# Patient Record
Sex: Male | Born: 1935 | Race: White | Hispanic: No | Marital: Married | Smoking: Never smoker
Health system: Southern US, Community
[De-identification: ages and names within clinical notes are randomized; demographics above are authoritative.]

## PROBLEM LIST (undated history)

## (undated) DIAGNOSIS — K2990 Gastroduodenitis, unspecified, without bleeding: Secondary | ICD-10-CM

## (undated) DIAGNOSIS — E119 Type 2 diabetes mellitus without complications: Secondary | ICD-10-CM

## (undated) DIAGNOSIS — N400 Enlarged prostate without lower urinary tract symptoms: Secondary | ICD-10-CM

## (undated) DIAGNOSIS — N289 Disorder of kidney and ureter, unspecified: Secondary | ICD-10-CM

## (undated) DIAGNOSIS — E785 Hyperlipidemia, unspecified: Secondary | ICD-10-CM

## (undated) DIAGNOSIS — D649 Anemia, unspecified: Secondary | ICD-10-CM

## (undated) DIAGNOSIS — R42 Dizziness and giddiness: Secondary | ICD-10-CM

## (undated) DIAGNOSIS — I10 Essential (primary) hypertension: Secondary | ICD-10-CM

## (undated) HISTORY — DX: Type 2 diabetes mellitus without complications: E11.9

## (undated) HISTORY — DX: Disorder of kidney and ureter, unspecified: N28.9

## (undated) HISTORY — DX: Hyperlipidemia, unspecified: E78.5

## (undated) HISTORY — DX: Dizziness and giddiness: R42

## (undated) HISTORY — DX: Anemia, unspecified: D64.9

## (undated) HISTORY — DX: Gastroduodenitis, unspecified, without bleeding: K29.90

## (undated) HISTORY — DX: Benign prostatic hyperplasia without lower urinary tract symptoms: N40.0

## (undated) HISTORY — DX: Essential (primary) hypertension: I10

---
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
## 1990-10-02 DIAGNOSIS — I10 Essential (primary) hypertension: Secondary | ICD-10-CM

## 1990-10-02 HISTORY — DX: Essential (primary) hypertension: I10

## 1998-06-02 ENCOUNTER — Encounter: Payer: Self-pay | Admitting: Family Medicine

## 1998-06-02 LAB — CONVERTED CEMR LAB: PSA: 1 ng/mL

## 1999-04-02 DIAGNOSIS — N289 Disorder of kidney and ureter, unspecified: Secondary | ICD-10-CM

## 1999-04-02 HISTORY — DX: Disorder of kidney and ureter, unspecified: N28.9

## 1999-06-03 HISTORY — PX: OTHER SURGICAL HISTORY: SHX169

## 1999-06-20 ENCOUNTER — Encounter: Payer: Self-pay | Admitting: Gastroenterology

## 1999-06-20 DIAGNOSIS — K2990 Gastroduodenitis, unspecified, without bleeding: Secondary | ICD-10-CM

## 1999-06-20 DIAGNOSIS — K449 Diaphragmatic hernia without obstruction or gangrene: Secondary | ICD-10-CM | POA: Insufficient documentation

## 1999-06-20 HISTORY — PX: ESOPHAGOGASTRODUODENOSCOPY: SHX1529

## 1999-06-20 HISTORY — DX: Gastroduodenitis, unspecified, without bleeding: K29.90

## 1999-06-20 HISTORY — PX: COLONOSCOPY: SHX174

## 1999-06-23 ENCOUNTER — Ambulatory Visit (HOSPITAL_COMMUNITY): Admission: RE | Admit: 1999-06-23 | Discharge: 1999-06-23 | Payer: Self-pay | Admitting: Gastroenterology

## 1999-06-23 ENCOUNTER — Encounter: Payer: Self-pay | Admitting: Gastroenterology

## 2000-04-01 ENCOUNTER — Encounter: Payer: Self-pay | Admitting: Family Medicine

## 2001-06-02 ENCOUNTER — Encounter: Payer: Self-pay | Admitting: Family Medicine

## 2002-05-02 ENCOUNTER — Encounter: Payer: Self-pay | Admitting: Family Medicine

## 2002-05-07 DIAGNOSIS — E119 Type 2 diabetes mellitus without complications: Secondary | ICD-10-CM

## 2002-05-07 HISTORY — DX: Type 2 diabetes mellitus without complications: E11.9

## 2002-12-01 ENCOUNTER — Encounter: Payer: Self-pay | Admitting: Family Medicine

## 2002-12-01 LAB — CONVERTED CEMR LAB: PSA: 2.1 ng/mL

## 2003-09-02 ENCOUNTER — Encounter: Payer: Self-pay | Admitting: Family Medicine

## 2003-09-02 LAB — CONVERTED CEMR LAB
Hgb A1c MFr Bld: 5.4 %
Microalbumin U total vol: 5.3 mg/L
PSA: 2.3 ng/mL

## 2004-01-31 ENCOUNTER — Encounter: Payer: Self-pay | Admitting: Family Medicine

## 2004-01-31 LAB — CONVERTED CEMR LAB: Hgb A1c MFr Bld: 5.4 %

## 2004-08-02 ENCOUNTER — Encounter: Payer: Self-pay | Admitting: Family Medicine

## 2004-08-02 LAB — CONVERTED CEMR LAB: Microalbumin U total vol: 2.6 mg/L

## 2004-08-17 ENCOUNTER — Ambulatory Visit: Payer: Self-pay | Admitting: Family Medicine

## 2004-08-19 ENCOUNTER — Ambulatory Visit: Payer: Self-pay | Admitting: Family Medicine

## 2004-09-02 ENCOUNTER — Ambulatory Visit: Payer: Self-pay | Admitting: Family Medicine

## 2004-09-19 ENCOUNTER — Encounter: Admission: RE | Admit: 2004-09-19 | Discharge: 2004-09-19 | Payer: Self-pay | Admitting: Nephrology

## 2004-10-14 ENCOUNTER — Ambulatory Visit (HOSPITAL_COMMUNITY): Admission: RE | Admit: 2004-10-14 | Discharge: 2004-10-14 | Payer: Self-pay | Admitting: Nephrology

## 2005-02-14 ENCOUNTER — Ambulatory Visit: Payer: Self-pay | Admitting: Family Medicine

## 2005-02-16 ENCOUNTER — Ambulatory Visit: Payer: Self-pay | Admitting: Family Medicine

## 2005-03-06 ENCOUNTER — Ambulatory Visit: Payer: Self-pay | Admitting: Family Medicine

## 2005-05-15 ENCOUNTER — Ambulatory Visit: Payer: Self-pay | Admitting: Family Medicine

## 2005-05-17 ENCOUNTER — Ambulatory Visit: Payer: Self-pay | Admitting: Family Medicine

## 2005-06-07 ENCOUNTER — Ambulatory Visit: Payer: Self-pay | Admitting: Family Medicine

## 2005-08-02 ENCOUNTER — Encounter: Payer: Self-pay | Admitting: Family Medicine

## 2005-08-02 LAB — CONVERTED CEMR LAB: PSA: 3.32 ng/mL

## 2005-08-14 ENCOUNTER — Ambulatory Visit: Payer: Self-pay | Admitting: Family Medicine

## 2005-08-17 ENCOUNTER — Ambulatory Visit: Payer: Self-pay | Admitting: Family Medicine

## 2005-09-12 ENCOUNTER — Ambulatory Visit: Payer: Self-pay | Admitting: Family Medicine

## 2005-09-26 ENCOUNTER — Ambulatory Visit: Payer: Self-pay | Admitting: Family Medicine

## 2005-11-02 ENCOUNTER — Encounter: Payer: Self-pay | Admitting: Family Medicine

## 2005-11-02 LAB — CONVERTED CEMR LAB: PSA: 3.45 ng/mL

## 2005-11-14 ENCOUNTER — Ambulatory Visit: Payer: Self-pay | Admitting: Family Medicine

## 2005-11-16 ENCOUNTER — Ambulatory Visit: Payer: Self-pay | Admitting: Family Medicine

## 2006-01-30 ENCOUNTER — Encounter: Payer: Self-pay | Admitting: Family Medicine

## 2006-01-30 LAB — CONVERTED CEMR LAB: PSA: 3.14 ng/mL

## 2006-02-12 ENCOUNTER — Ambulatory Visit: Payer: Self-pay | Admitting: Family Medicine

## 2006-02-14 ENCOUNTER — Ambulatory Visit: Payer: Self-pay | Admitting: Family Medicine

## 2006-08-02 ENCOUNTER — Encounter: Payer: Self-pay | Admitting: Family Medicine

## 2006-08-02 LAB — CONVERTED CEMR LAB: PSA: 3.15 ng/mL

## 2006-08-17 ENCOUNTER — Ambulatory Visit: Payer: Self-pay | Admitting: Family Medicine

## 2006-08-21 ENCOUNTER — Ambulatory Visit: Payer: Self-pay | Admitting: Family Medicine

## 2006-09-14 ENCOUNTER — Ambulatory Visit: Payer: Self-pay | Admitting: Family Medicine

## 2006-09-18 ENCOUNTER — Ambulatory Visit: Payer: Self-pay | Admitting: Family Medicine

## 2006-09-20 ENCOUNTER — Ambulatory Visit: Payer: Self-pay | Admitting: Family Medicine

## 2006-11-02 ENCOUNTER — Encounter: Payer: Self-pay | Admitting: Family Medicine

## 2006-11-19 ENCOUNTER — Ambulatory Visit: Payer: Self-pay | Admitting: Family Medicine

## 2006-11-19 LAB — CONVERTED CEMR LAB
ALT: 14 units/L (ref 0–40)
AST: 17 units/L (ref 0–37)
Alkaline Phosphatase: 69 units/L (ref 39–117)
BUN: 47 mg/dL — ABNORMAL HIGH (ref 6–23)
CO2: 26 meq/L (ref 19–32)
Calcium: 10.5 mg/dL (ref 8.4–10.5)
GFR calc non Af Amer: 28 mL/min
Potassium: 4.8 meq/L (ref 3.5–5.1)
Total Protein: 6.5 g/dL (ref 6.0–8.3)

## 2006-11-21 ENCOUNTER — Ambulatory Visit: Payer: Self-pay | Admitting: Family Medicine

## 2006-12-18 ENCOUNTER — Ambulatory Visit: Payer: Self-pay | Admitting: Family Medicine

## 2006-12-18 LAB — CONVERTED CEMR LAB
Albumin: 3.9 g/dL (ref 3.5–5.2)
BUN: 33 mg/dL — ABNORMAL HIGH (ref 6–23)
Calcium: 10.6 mg/dL — ABNORMAL HIGH (ref 8.4–10.5)
Creatinine, Ser: 2.4 mg/dL — ABNORMAL HIGH (ref 0.4–1.5)
Hgb A1c MFr Bld: 5.8 % (ref 4.6–6.0)
Phosphorus: 2.8 mg/dL (ref 2.3–4.6)

## 2007-01-17 ENCOUNTER — Ambulatory Visit: Payer: Self-pay | Admitting: Family Medicine

## 2007-01-17 LAB — CONVERTED CEMR LAB
Albumin: 3.5 g/dL (ref 3.5–5.2)
BUN: 27 mg/dL — ABNORMAL HIGH (ref 6–23)
Chloride: 105 meq/L (ref 96–112)
GFR calc Af Amer: 39 mL/min
Phosphorus: 2.2 mg/dL — ABNORMAL LOW (ref 2.3–4.6)
Potassium: 4.7 meq/L (ref 3.5–5.1)
Sodium: 137 meq/L (ref 135–145)

## 2007-01-21 ENCOUNTER — Ambulatory Visit: Payer: Self-pay | Admitting: Family Medicine

## 2007-02-14 ENCOUNTER — Telehealth (INDEPENDENT_AMBULATORY_CARE_PROVIDER_SITE_OTHER): Payer: Self-pay | Admitting: *Deleted

## 2007-03-06 ENCOUNTER — Ambulatory Visit: Payer: Self-pay | Admitting: Family Medicine

## 2007-03-06 DIAGNOSIS — T7840XA Allergy, unspecified, initial encounter: Secondary | ICD-10-CM | POA: Insufficient documentation

## 2007-03-06 DIAGNOSIS — R351 Nocturia: Secondary | ICD-10-CM | POA: Insufficient documentation

## 2007-03-06 DIAGNOSIS — N2581 Secondary hyperparathyroidism of renal origin: Secondary | ICD-10-CM | POA: Insufficient documentation

## 2007-03-06 LAB — CONVERTED CEMR LAB
Bacteria, UA: 0
Epithelial cells, urine: 0 /lpf
Ketones, urine, test strip: NEGATIVE

## 2007-03-19 ENCOUNTER — Ambulatory Visit: Payer: Self-pay | Admitting: Family Medicine

## 2007-03-19 DIAGNOSIS — R7982 Elevated C-reactive protein (CRP): Secondary | ICD-10-CM

## 2007-03-19 LAB — CONVERTED CEMR LAB
Creatinine, Ser: 2.3 mg/dL — ABNORMAL HIGH (ref 0.4–1.5)
GFR calc Af Amer: 35 mL/min
GFR calc non Af Amer: 29 mL/min
Glucose, Bld: 98 mg/dL (ref 70–99)
Hgb A1c MFr Bld: 6.1 % — ABNORMAL HIGH (ref 4.6–6.0)
Potassium: 5.2 meq/L — ABNORMAL HIGH (ref 3.5–5.1)
Sodium: 136 meq/L (ref 135–145)

## 2007-03-20 ENCOUNTER — Encounter (INDEPENDENT_AMBULATORY_CARE_PROVIDER_SITE_OTHER): Payer: Self-pay | Admitting: *Deleted

## 2007-03-22 ENCOUNTER — Ambulatory Visit: Payer: Self-pay | Admitting: Family Medicine

## 2007-03-27 ENCOUNTER — Telehealth (INDEPENDENT_AMBULATORY_CARE_PROVIDER_SITE_OTHER): Payer: Self-pay | Admitting: *Deleted

## 2007-04-02 ENCOUNTER — Telehealth: Payer: Self-pay | Admitting: Family Medicine

## 2007-04-02 ENCOUNTER — Ambulatory Visit: Payer: Self-pay | Admitting: Family Medicine

## 2007-04-02 DIAGNOSIS — N401 Enlarged prostate with lower urinary tract symptoms: Secondary | ICD-10-CM

## 2007-04-02 DIAGNOSIS — R5381 Other malaise: Secondary | ICD-10-CM

## 2007-04-02 DIAGNOSIS — R5383 Other fatigue: Secondary | ICD-10-CM | POA: Insufficient documentation

## 2007-04-19 ENCOUNTER — Ambulatory Visit: Payer: Self-pay | Admitting: Family Medicine

## 2007-04-19 DIAGNOSIS — I10 Essential (primary) hypertension: Secondary | ICD-10-CM

## 2007-04-19 LAB — CONVERTED CEMR LAB
GFR calc non Af Amer: 25 mL/min
Potassium: 4.3 meq/L (ref 3.5–5.1)

## 2007-04-24 ENCOUNTER — Ambulatory Visit: Payer: Self-pay | Admitting: Family Medicine

## 2007-04-24 ENCOUNTER — Encounter (INDEPENDENT_AMBULATORY_CARE_PROVIDER_SITE_OTHER): Payer: Self-pay | Admitting: *Deleted

## 2007-04-25 ENCOUNTER — Ambulatory Visit: Payer: Self-pay | Admitting: Family Medicine

## 2007-04-26 ENCOUNTER — Encounter: Payer: Self-pay | Admitting: Family Medicine

## 2007-04-26 DIAGNOSIS — D649 Anemia, unspecified: Secondary | ICD-10-CM

## 2007-04-26 LAB — CONVERTED CEMR LAB
Eosinophils Absolute: 0.2 10*3/uL (ref 0.0–0.6)
Ferritin: 34.4 ng/mL (ref 22.0–322.0)
Hemoglobin: 10.5 g/dL — ABNORMAL LOW (ref 13.0–17.0)
Lymphocytes Relative: 43.2 % (ref 12.0–46.0)
MCHC: 32.8 g/dL (ref 30.0–36.0)
Monocytes Absolute: 0.5 10*3/uL (ref 0.2–0.7)
Monocytes Relative: 9.6 % (ref 3.0–11.0)
Neutro Abs: 2 10*3/uL (ref 1.4–7.7)
Neutrophils Relative %: 42.8 % — ABNORMAL LOW (ref 43.0–77.0)
Platelets: 142 10*3/uL — ABNORMAL LOW (ref 150–400)
RBC: 3.4 M/uL — ABNORMAL LOW (ref 4.22–5.81)
RDW: 12.8 % (ref 11.5–14.6)
TSH: 0.55 microintl units/mL (ref 0.35–5.50)

## 2007-04-29 ENCOUNTER — Ambulatory Visit: Payer: Self-pay | Admitting: Family Medicine

## 2007-04-29 ENCOUNTER — Telehealth: Payer: Self-pay | Admitting: Family Medicine

## 2007-06-10 ENCOUNTER — Ambulatory Visit: Payer: Self-pay | Admitting: Family Medicine

## 2007-06-10 LAB — CONVERTED CEMR LAB
Eosinophils Relative: 3.9 % (ref 0.0–5.0)
Hemoglobin: 10.1 g/dL — ABNORMAL LOW (ref 13.0–17.0)
Lymphocytes Relative: 39 % (ref 12.0–46.0)
MCHC: 33.2 g/dL (ref 30.0–36.0)
Monocytes Absolute: 0.5 10*3/uL (ref 0.2–0.7)
Neutrophils Relative %: 46.2 % (ref 43.0–77.0)
Platelets: 124 10*3/uL — ABNORMAL LOW (ref 150–400)
WBC: 5 10*3/uL (ref 4.5–10.5)

## 2007-06-13 ENCOUNTER — Ambulatory Visit: Payer: Self-pay | Admitting: Family Medicine

## 2007-06-14 ENCOUNTER — Ambulatory Visit: Payer: Self-pay | Admitting: Oncology

## 2007-07-01 ENCOUNTER — Encounter: Payer: Self-pay | Admitting: Family Medicine

## 2007-07-01 LAB — COMPREHENSIVE METABOLIC PANEL
ALT: 17 U/L (ref 0–53)
AST: 21 U/L (ref 0–37)
Albumin: 3.9 g/dL (ref 3.5–5.2)
Alkaline Phosphatase: 69 U/L (ref 39–117)
BUN: 37 mg/dL — ABNORMAL HIGH (ref 6–23)
CO2: 25 mEq/L (ref 19–32)
Calcium: 10.4 mg/dL (ref 8.4–10.5)
Chloride: 100 mEq/L (ref 96–112)
Creatinine, Ser: 2.37 mg/dL — ABNORMAL HIGH (ref 0.40–1.50)
Glucose, Bld: 143 mg/dL — ABNORMAL HIGH (ref 70–99)
Potassium: 4.6 mEq/L (ref 3.5–5.3)
Sodium: 132 mEq/L — ABNORMAL LOW (ref 135–145)
Total Bilirubin: 0.7 mg/dL (ref 0.3–1.2)
Total Protein: 5.9 g/dL — ABNORMAL LOW (ref 6.0–8.3)

## 2007-07-01 LAB — CBC WITH DIFFERENTIAL (CANCER CENTER ONLY)
BASO#: 0 10*3/uL (ref 0.0–0.2)
BASO%: 0.3 % (ref 0.0–2.0)
EOS%: 1.8 % (ref 0.0–7.0)
Eosinophils Absolute: 0.1 10*3/uL (ref 0.0–0.5)
HCT: 28.3 % — ABNORMAL LOW (ref 38.7–49.9)
HGB: 9.7 g/dL — ABNORMAL LOW (ref 13.0–17.1)
LYMPH#: 1.7 10*3/uL (ref 0.9–3.3)
LYMPH%: 31.9 % (ref 14.0–48.0)
MCH: 31.5 pg (ref 28.0–33.4)
MCHC: 34.4 g/dL (ref 32.0–35.9)
MCV: 92 fL (ref 82–98)
MONO#: 0.3 10*3/uL (ref 0.1–0.9)
MONO%: 6.2 % (ref 0.0–13.0)
NEUT#: 3.3 10*3/uL (ref 1.5–6.5)
NEUT%: 59.8 % (ref 40.0–80.0)
Platelets: 130 10*3/uL — ABNORMAL LOW (ref 145–400)
RBC: 3.09 10*6/uL — ABNORMAL LOW (ref 4.20–5.70)
RDW: 12.6 % (ref 10.5–14.6)
WBC: 5.4 10*3/uL (ref 4.0–10.0)

## 2007-07-01 LAB — MORPHOLOGY - CHCC SATELLITE: Platelet Morphology: NORMAL

## 2007-07-03 LAB — IRON AND TIBC
%SAT: 39 % (ref 20–55)
Iron: 116 ug/dL (ref 42–165)
TIBC: 299 ug/dL (ref 215–435)
UIBC: 183 ug/dL

## 2007-07-03 LAB — PROTEIN ELECTROPHORESIS, SERUM
Beta 2: 4.7 % (ref 3.2–6.5)
Beta Globulin: 6.5 % (ref 4.7–7.2)
Gamma Globulin: 12.4 % (ref 11.1–18.8)

## 2007-07-03 LAB — FERRITIN: Ferritin: 32 ng/mL (ref 22–322)

## 2007-07-08 ENCOUNTER — Ambulatory Visit (HOSPITAL_COMMUNITY): Admission: RE | Admit: 2007-07-08 | Discharge: 2007-07-08 | Payer: Self-pay | Admitting: Oncology

## 2007-07-08 ENCOUNTER — Encounter (INDEPENDENT_AMBULATORY_CARE_PROVIDER_SITE_OTHER): Payer: Self-pay | Admitting: Interventional Radiology

## 2007-07-09 ENCOUNTER — Telehealth: Payer: Self-pay | Admitting: Family Medicine

## 2007-07-23 ENCOUNTER — Telehealth (INDEPENDENT_AMBULATORY_CARE_PROVIDER_SITE_OTHER): Payer: Self-pay | Admitting: *Deleted

## 2007-07-26 ENCOUNTER — Ambulatory Visit: Payer: Self-pay | Admitting: Oncology

## 2007-07-26 ENCOUNTER — Ambulatory Visit: Payer: Self-pay | Admitting: Gastroenterology

## 2007-07-29 ENCOUNTER — Encounter: Payer: Self-pay | Admitting: Family Medicine

## 2007-07-29 LAB — CBC WITH DIFFERENTIAL (CANCER CENTER ONLY)
BASO#: 0 10*3/uL (ref 0.0–0.2)
BASO%: 0.3 % (ref 0.0–2.0)
EOS%: 2.9 % (ref 0.0–7.0)
HGB: 9.8 g/dL — ABNORMAL LOW (ref 13.0–17.1)
MCH: 32.2 pg (ref 28.0–33.4)
MCHC: 34.3 g/dL (ref 32.0–35.9)
MONO%: 6.3 % (ref 0.0–13.0)
NEUT#: 2.5 10*3/uL (ref 1.5–6.5)
NEUT%: 55.4 % (ref 40.0–80.0)
RDW: 11.6 % (ref 10.5–14.6)

## 2007-08-05 ENCOUNTER — Telehealth: Payer: Self-pay | Admitting: Family Medicine

## 2007-08-19 LAB — CBC WITH DIFFERENTIAL (CANCER CENTER ONLY)
BASO%: 0.3 % (ref 0.0–2.0)
EOS%: 3.8 % (ref 0.0–7.0)
LYMPH#: 1.8 10*3/uL (ref 0.9–3.3)
MCH: 30.1 pg (ref 28.0–33.4)
MCHC: 33.4 g/dL (ref 32.0–35.9)
MONO%: 7.8 % (ref 0.0–13.0)
NEUT#: 2 10*3/uL (ref 1.5–6.5)
Platelets: 160 10*3/uL (ref 145–400)

## 2007-09-06 ENCOUNTER — Ambulatory Visit: Payer: Self-pay | Admitting: Oncology

## 2007-09-09 LAB — BASIC METABOLIC PANEL - CANCER CENTER ONLY
BUN, Bld: 34 mg/dL — ABNORMAL HIGH (ref 7–22)
Chloride: 100 mEq/L (ref 98–108)
Glucose, Bld: 86 mg/dL (ref 73–118)
Potassium: 4.6 mEq/L (ref 3.3–4.7)

## 2007-09-09 LAB — CBC WITH DIFFERENTIAL (CANCER CENTER ONLY)
Eosinophils Absolute: 0.2 10*3/uL (ref 0.0–0.5)
HGB: 11 g/dL — ABNORMAL LOW (ref 13.0–17.1)
LYMPH#: 1.9 10*3/uL (ref 0.9–3.3)
MONO#: 0.4 10*3/uL (ref 0.1–0.9)
MONO%: 8.6 % (ref 0.0–13.0)
NEUT#: 1.9 10*3/uL (ref 1.5–6.5)
Platelets: 134 10*3/uL — ABNORMAL LOW (ref 145–400)
RBC: 3.83 10*6/uL — ABNORMAL LOW (ref 4.20–5.70)
WBC: 4.4 10*3/uL (ref 4.0–10.0)

## 2007-09-09 LAB — IRON AND TIBC: UIBC: 243 ug/dL

## 2007-09-09 LAB — FERRITIN: Ferritin: 15 ng/mL — ABNORMAL LOW (ref 22–322)

## 2007-09-30 ENCOUNTER — Encounter: Payer: Self-pay | Admitting: Family Medicine

## 2007-09-30 LAB — CBC WITH DIFFERENTIAL (CANCER CENTER ONLY)
BASO#: 0 10*3/uL (ref 0.0–0.2)
Eosinophils Absolute: 0.1 10*3/uL (ref 0.0–0.5)
HCT: 30.9 % — ABNORMAL LOW (ref 38.7–49.9)
HGB: 10.3 g/dL — ABNORMAL LOW (ref 13.0–17.1)
LYMPH%: 38.6 % (ref 14.0–48.0)
MCV: 83 fL (ref 82–98)
MONO#: 0.3 10*3/uL (ref 0.1–0.9)
Platelets: 127 10*3/uL — ABNORMAL LOW (ref 145–400)
RBC: 3.71 10*6/uL — ABNORMAL LOW (ref 4.20–5.70)
WBC: 4 10*3/uL (ref 4.0–10.0)

## 2007-10-17 ENCOUNTER — Ambulatory Visit: Payer: Self-pay | Admitting: Oncology

## 2007-10-21 LAB — CBC WITH DIFFERENTIAL (CANCER CENTER ONLY)
BASO#: 0 10*3/uL (ref 0.0–0.2)
Eosinophils Absolute: 0.2 10*3/uL (ref 0.0–0.5)
HCT: 36.6 % — ABNORMAL LOW (ref 38.7–49.9)
HGB: 12.1 g/dL — ABNORMAL LOW (ref 13.0–17.1)
LYMPH%: 44.8 % (ref 14.0–48.0)
MCH: 27.9 pg — ABNORMAL LOW (ref 28.0–33.4)
MCV: 84 fL (ref 82–98)
MONO#: 0.4 10*3/uL (ref 0.1–0.9)
MONO%: 9.4 % (ref 0.0–13.0)
NEUT%: 41 % (ref 40.0–80.0)
RBC: 4.33 10*6/uL (ref 4.20–5.70)
WBC: 3.9 10*3/uL — ABNORMAL LOW (ref 4.0–10.0)

## 2007-10-30 ENCOUNTER — Ambulatory Visit: Payer: Self-pay | Admitting: Family Medicine

## 2007-10-30 LAB — CONVERTED CEMR LAB
BUN: 39 mg/dL — ABNORMAL HIGH (ref 6–23)
CO2: 25 meq/L (ref 19–32)
Chloride: 109 meq/L (ref 96–112)
Creatinine, Ser: 2.1 mg/dL — ABNORMAL HIGH (ref 0.4–1.5)
GFR calc non Af Amer: 32 mL/min
Glucose, Bld: 100 mg/dL — ABNORMAL HIGH (ref 70–99)
Sodium: 141 meq/L (ref 135–145)

## 2007-11-04 ENCOUNTER — Ambulatory Visit: Payer: Self-pay | Admitting: Family Medicine

## 2007-11-11 LAB — CBC WITH DIFFERENTIAL (CANCER CENTER ONLY)
BASO#: 0 10*3/uL (ref 0.0–0.2)
EOS%: 4.2 % (ref 0.0–7.0)
Eosinophils Absolute: 0.2 10*3/uL (ref 0.0–0.5)
HGB: 11.9 g/dL — ABNORMAL LOW (ref 13.0–17.1)
LYMPH#: 1.7 10*3/uL (ref 0.9–3.3)
MCH: 27.4 pg — ABNORMAL LOW (ref 28.0–33.4)
MONO%: 6.9 % (ref 0.0–13.0)
NEUT#: 2 10*3/uL (ref 1.5–6.5)
Platelets: 110 10*3/uL — ABNORMAL LOW (ref 145–400)
RBC: 4.35 10*6/uL (ref 4.20–5.70)

## 2007-11-27 DIAGNOSIS — I2510 Atherosclerotic heart disease of native coronary artery without angina pectoris: Secondary | ICD-10-CM | POA: Insufficient documentation

## 2007-11-27 DIAGNOSIS — E785 Hyperlipidemia, unspecified: Secondary | ICD-10-CM

## 2007-11-27 DIAGNOSIS — K2960 Other gastritis without bleeding: Secondary | ICD-10-CM | POA: Insufficient documentation

## 2007-11-27 DIAGNOSIS — H9190 Unspecified hearing loss, unspecified ear: Secondary | ICD-10-CM | POA: Insufficient documentation

## 2007-11-28 ENCOUNTER — Ambulatory Visit: Payer: Self-pay | Admitting: Oncology

## 2007-12-02 LAB — CBC WITH DIFFERENTIAL (CANCER CENTER ONLY)
BASO#: 0 10*3/uL (ref 0.0–0.2)
BASO%: 0.3 % (ref 0.0–2.0)
HCT: 33.2 % — ABNORMAL LOW (ref 38.7–49.9)
HGB: 11.4 g/dL — ABNORMAL LOW (ref 13.0–17.1)
LYMPH%: 44.1 % (ref 14.0–48.0)
MCV: 82 fL (ref 82–98)
MONO#: 0.4 10*3/uL (ref 0.1–0.9)
NEUT%: 41.2 % (ref 40.0–80.0)
RDW: 18.4 % — ABNORMAL HIGH (ref 10.5–14.6)
WBC: 4.9 10*3/uL (ref 4.0–10.0)

## 2007-12-04 ENCOUNTER — Encounter: Payer: Self-pay | Admitting: Family Medicine

## 2007-12-11 DIAGNOSIS — E559 Vitamin D deficiency, unspecified: Secondary | ICD-10-CM | POA: Insufficient documentation

## 2007-12-23 ENCOUNTER — Encounter: Payer: Self-pay | Admitting: Family Medicine

## 2007-12-23 LAB — CBC WITH DIFFERENTIAL (CANCER CENTER ONLY)
BASO#: 0 10*3/uL (ref 0.0–0.2)
BASO%: 0.3 % (ref 0.0–2.0)
EOS%: 3.5 % (ref 0.0–7.0)
LYMPH#: 1.5 10*3/uL (ref 0.9–3.3)
MCH: 28.8 pg (ref 28.0–33.4)
MCHC: 32.9 g/dL (ref 32.0–35.9)
MONO%: 8.8 % (ref 0.0–13.0)
NEUT#: 2.1 10*3/uL (ref 1.5–6.5)
Platelets: 134 10*3/uL — ABNORMAL LOW (ref 145–400)
RDW: 18 % — ABNORMAL HIGH (ref 10.5–14.6)

## 2008-01-07 ENCOUNTER — Ambulatory Visit: Payer: Self-pay | Admitting: Oncology

## 2008-01-13 LAB — CBC WITH DIFFERENTIAL (CANCER CENTER ONLY)
BASO#: 0 10*3/uL (ref 0.0–0.2)
BASO%: 0.5 % (ref 0.0–2.0)
EOS%: 3.5 % (ref 0.0–7.0)
HCT: 36.7 % — ABNORMAL LOW (ref 38.7–49.9)
HGB: 12.2 g/dL — ABNORMAL LOW (ref 13.0–17.1)
LYMPH#: 2 10*3/uL (ref 0.9–3.3)
LYMPH%: 45.6 % (ref 14.0–48.0)
MCH: 29.5 pg (ref 28.0–33.4)
MCHC: 33.2 g/dL (ref 32.0–35.9)
MCV: 89 fL (ref 82–98)
MONO%: 9.1 % (ref 0.0–13.0)
NEUT%: 41.3 % (ref 40.0–80.0)
RDW: 14.9 % — ABNORMAL HIGH (ref 10.5–14.6)

## 2008-01-15 ENCOUNTER — Telehealth: Payer: Self-pay | Admitting: Family Medicine

## 2008-01-15 ENCOUNTER — Encounter: Payer: Self-pay | Admitting: Family Medicine

## 2008-01-20 ENCOUNTER — Ambulatory Visit: Payer: Self-pay | Admitting: Family Medicine

## 2008-01-20 LAB — CONVERTED CEMR LAB
CO2: 24 meq/L (ref 19–32)
Calcium: 10.3 mg/dL (ref 8.4–10.5)
GFR calc Af Amer: 41 mL/min
Glucose, Bld: 93 mg/dL (ref 70–99)
Hgb A1c MFr Bld: 5.4 % (ref 4.6–6.0)
Potassium: 4.5 meq/L (ref 3.5–5.1)

## 2008-01-21 ENCOUNTER — Ambulatory Visit: Payer: Self-pay | Admitting: Family Medicine

## 2008-01-22 ENCOUNTER — Telehealth: Payer: Self-pay | Admitting: Family Medicine

## 2008-02-03 LAB — CBC WITH DIFFERENTIAL (CANCER CENTER ONLY)
BASO#: 0 10*3/uL (ref 0.0–0.2)
EOS%: 3.6 % (ref 0.0–7.0)
HCT: 33.6 % — ABNORMAL LOW (ref 38.7–49.9)
HGB: 11.3 g/dL — ABNORMAL LOW (ref 13.0–17.1)
LYMPH#: 2 10*3/uL (ref 0.9–3.3)
LYMPH%: 38.5 % (ref 14.0–48.0)
MCH: 30 pg (ref 28.0–33.4)
MCHC: 33.6 g/dL (ref 32.0–35.9)
MCV: 89 fL (ref 82–98)
MONO%: 7.6 % (ref 0.0–13.0)
NEUT%: 50 % (ref 40.0–80.0)

## 2008-02-20 ENCOUNTER — Ambulatory Visit: Payer: Self-pay | Admitting: Oncology

## 2008-02-20 ENCOUNTER — Ambulatory Visit: Payer: Self-pay | Admitting: Family Medicine

## 2008-02-20 LAB — CONVERTED CEMR LAB
CO2: 22 meq/L (ref 19–32)
GFR calc Af Amer: 50 mL/min
GFR calc non Af Amer: 41 mL/min
Potassium: 5.1 meq/L (ref 3.5–5.1)

## 2008-02-26 ENCOUNTER — Ambulatory Visit: Payer: Self-pay | Admitting: Family Medicine

## 2008-03-16 LAB — CBC WITH DIFFERENTIAL (CANCER CENTER ONLY)
BASO#: 0 10*3/uL (ref 0.0–0.2)
EOS%: 4.8 % (ref 0.0–7.0)
HGB: 12.4 g/dL — ABNORMAL LOW (ref 13.0–17.1)
MCH: 31 pg (ref 28.0–33.4)
MCHC: 35.1 g/dL (ref 32.0–35.9)
MONO%: 7.1 % (ref 0.0–13.0)
NEUT#: 2.6 10*3/uL (ref 1.5–6.5)

## 2008-04-01 ENCOUNTER — Ambulatory Visit: Payer: Self-pay | Admitting: Oncology

## 2008-04-06 LAB — CBC WITH DIFFERENTIAL (CANCER CENTER ONLY)
Eosinophils Absolute: 0.2 10*3/uL (ref 0.0–0.5)
LYMPH#: 2 10*3/uL (ref 0.9–3.3)
MONO#: 0.4 10*3/uL (ref 0.1–0.9)
NEUT#: 2.6 10*3/uL (ref 1.5–6.5)
Platelets: 105 10*3/uL — ABNORMAL LOW (ref 145–400)
RBC: 4.24 10*6/uL (ref 4.20–5.70)
WBC: 5.2 10*3/uL (ref 4.0–10.0)

## 2008-04-27 LAB — CBC WITH DIFFERENTIAL (CANCER CENTER ONLY)
BASO%: 0.3 % (ref 0.0–2.0)
EOS%: 3.9 % (ref 0.0–7.0)
Eosinophils Absolute: 0.2 10*3/uL (ref 0.0–0.5)
MCH: 30.2 pg (ref 28.0–33.4)
MCHC: 34.9 g/dL (ref 32.0–35.9)
MONO%: 7.3 % (ref 0.0–13.0)
NEUT#: 2.1 10*3/uL (ref 1.5–6.5)
Platelets: 85 10*3/uL — ABNORMAL LOW (ref 145–400)
RBC: 3.34 10*6/uL — ABNORMAL LOW (ref 4.20–5.70)

## 2008-05-12 ENCOUNTER — Ambulatory Visit: Payer: Self-pay | Admitting: Oncology

## 2008-05-18 ENCOUNTER — Encounter: Payer: Self-pay | Admitting: Family Medicine

## 2008-05-18 LAB — CBC WITH DIFFERENTIAL (CANCER CENTER ONLY)
Eosinophils Absolute: 0.1 10*3/uL (ref 0.0–0.5)
HCT: 32.7 % — ABNORMAL LOW (ref 38.7–49.9)
LYMPH#: 1.7 10*3/uL (ref 0.9–3.3)
LYMPH%: 39.2 % (ref 14.0–48.0)
MCV: 90 fL (ref 82–98)
MONO#: 0.4 10*3/uL (ref 0.1–0.9)
NEUT%: 48.6 % (ref 40.0–80.0)
Platelets: 124 10*3/uL — ABNORMAL LOW (ref 145–400)
RBC: 3.62 10*6/uL — ABNORMAL LOW (ref 4.20–5.70)
WBC: 4.2 10*3/uL (ref 4.0–10.0)

## 2008-05-18 LAB — IRON AND TIBC
%SAT: 57 % — ABNORMAL HIGH (ref 20–55)
Iron: 146 ug/dL (ref 42–165)
TIBC: 257 ug/dL (ref 215–435)
UIBC: 111 ug/dL

## 2008-06-09 LAB — CBC WITH DIFFERENTIAL (CANCER CENTER ONLY)
BASO%: 0.4 % (ref 0.0–2.0)
EOS%: 4.4 % (ref 0.0–7.0)
HGB: 12.4 g/dL — ABNORMAL LOW (ref 13.0–17.1)
LYMPH#: 1.5 10*3/uL (ref 0.9–3.3)
MCHC: 34.2 g/dL (ref 32.0–35.9)
MONO%: 8.1 % (ref 0.0–13.0)
NEUT#: 2.8 10*3/uL (ref 1.5–6.5)
Platelets: 133 10*3/uL — ABNORMAL LOW (ref 145–400)
RDW: 14.4 % (ref 10.5–14.6)

## 2008-06-17 ENCOUNTER — Encounter: Payer: Self-pay | Admitting: Family Medicine

## 2008-06-17 DIAGNOSIS — M899 Disorder of bone, unspecified: Secondary | ICD-10-CM | POA: Insufficient documentation

## 2008-06-17 DIAGNOSIS — M949 Disorder of cartilage, unspecified: Secondary | ICD-10-CM

## 2008-06-24 ENCOUNTER — Ambulatory Visit: Payer: Self-pay | Admitting: Oncology

## 2008-06-29 LAB — CBC WITH DIFFERENTIAL (CANCER CENTER ONLY)
BASO%: 0.4 % (ref 0.0–2.0)
EOS%: 4.4 % (ref 0.0–7.0)
HCT: 33 % — ABNORMAL LOW (ref 38.7–49.9)
LYMPH#: 1.6 10*3/uL (ref 0.9–3.3)
LYMPH%: 35.5 % (ref 14.0–48.0)
MCH: 31.5 pg (ref 28.0–33.4)
MCHC: 34.7 g/dL (ref 32.0–35.9)
MONO%: 6.4 % (ref 0.0–13.0)
NEUT%: 53.3 % (ref 40.0–80.0)
RDW: 13.6 % (ref 10.5–14.6)

## 2008-07-20 LAB — CBC WITH DIFFERENTIAL (CANCER CENTER ONLY)
BASO%: 0.6 % (ref 0.0–2.0)
HCT: 35.7 % — ABNORMAL LOW (ref 38.7–49.9)
LYMPH%: 37.6 % (ref 14.0–48.0)
MCHC: 34.1 g/dL (ref 32.0–35.9)
MCV: 91 fL (ref 82–98)
MONO#: 0.2 10*3/uL (ref 0.1–0.9)
NEUT%: 50.8 % (ref 40.0–80.0)
RDW: 12.9 % (ref 10.5–14.6)
WBC: 4.1 10*3/uL (ref 4.0–10.0)

## 2008-07-24 ENCOUNTER — Encounter: Payer: Self-pay | Admitting: Family Medicine

## 2008-08-07 ENCOUNTER — Ambulatory Visit: Payer: Self-pay | Admitting: Oncology

## 2008-08-10 LAB — CBC WITH DIFFERENTIAL (CANCER CENTER ONLY)
Eosinophils Absolute: 0.2 10*3/uL (ref 0.0–0.5)
HCT: 34.8 % — ABNORMAL LOW (ref 38.7–49.9)
LYMPH%: 33.8 % (ref 14.0–48.0)
MCV: 89 fL (ref 82–98)
MONO#: 0.4 10*3/uL (ref 0.1–0.9)
NEUT%: 53 % (ref 40.0–80.0)
RBC: 3.9 10*6/uL — ABNORMAL LOW (ref 4.20–5.70)
WBC: 4.8 10*3/uL (ref 4.0–10.0)

## 2008-08-20 ENCOUNTER — Encounter: Payer: Self-pay | Admitting: Family Medicine

## 2008-08-26 ENCOUNTER — Ambulatory Visit: Payer: Self-pay | Admitting: Family Medicine

## 2008-08-30 LAB — CONVERTED CEMR LAB
ALT: 19 units/L (ref 0–53)
Alkaline Phosphatase: 62 units/L (ref 39–117)
Basophils Relative: 0 % (ref 0.0–3.0)
Bilirubin, Direct: 0.1 mg/dL (ref 0.0–0.3)
CO2: 23 meq/L (ref 19–32)
Calcium, Total (PTH): 10.3 mg/dL (ref 8.4–10.5)
Chloride: 109 meq/L (ref 96–112)
Cholesterol: 108 mg/dL (ref 0–200)
Creatinine,U: 183.6 mg/dL
Eosinophils Absolute: 0.2 10*3/uL (ref 0.0–0.7)
GFR calc Af Amer: 35 mL/min
HCT: 38.1 % — ABNORMAL LOW (ref 39.0–52.0)
Hemoglobin: 12.8 g/dL — ABNORMAL LOW (ref 13.0–17.0)
Hgb A1c MFr Bld: 5.9 % (ref 4.6–6.0)
LDL Cholesterol: 51 mg/dL (ref 0–99)
Lymphocytes Relative: 45.6 % (ref 12.0–46.0)
MCV: 92.9 fL (ref 78.0–100.0)
Monocytes Relative: 7.7 % (ref 3.0–12.0)
Neutrophils Relative %: 40.9 % — ABNORMAL LOW (ref 43.0–77.0)
PSA: 0.84 ng/mL (ref 0.10–4.00)
Sodium: 139 meq/L (ref 135–145)
Total Bilirubin: 0.7 mg/dL (ref 0.3–1.2)
Total Protein: 6 g/dL (ref 6.0–8.3)
Vit D, 1,25-Dihydroxy: 35 (ref 30–89)
WBC: 4.3 10*3/uL — ABNORMAL LOW (ref 4.5–10.5)

## 2008-09-03 ENCOUNTER — Ambulatory Visit: Payer: Self-pay | Admitting: Family Medicine

## 2008-09-16 ENCOUNTER — Encounter (INDEPENDENT_AMBULATORY_CARE_PROVIDER_SITE_OTHER): Payer: Self-pay | Admitting: *Deleted

## 2008-09-16 ENCOUNTER — Ambulatory Visit: Payer: Self-pay | Admitting: Family Medicine

## 2008-09-16 LAB — CONVERTED CEMR LAB
OCCULT 1: NEGATIVE
OCCULT 3: NEGATIVE

## 2008-09-17 ENCOUNTER — Ambulatory Visit: Payer: Self-pay | Admitting: Oncology

## 2008-10-05 ENCOUNTER — Telehealth: Payer: Self-pay | Admitting: Family Medicine

## 2008-10-12 LAB — CBC WITH DIFFERENTIAL (CANCER CENTER ONLY)
Eosinophils Absolute: 0.2 10*3/uL (ref 0.0–0.5)
MCV: 88 fL (ref 82–98)
MONO#: 0.4 10*3/uL (ref 0.1–0.9)
NEUT#: 2.1 10*3/uL (ref 1.5–6.5)
Platelets: 137 10*3/uL — ABNORMAL LOW (ref 145–400)
RBC: 3.42 10*6/uL — ABNORMAL LOW (ref 4.20–5.70)
WBC: 4.1 10*3/uL (ref 4.0–10.0)

## 2008-10-29 ENCOUNTER — Ambulatory Visit: Payer: Self-pay | Admitting: Oncology

## 2008-11-09 LAB — CBC WITH DIFFERENTIAL (CANCER CENTER ONLY)
EOS%: 4.5 % (ref 0.0–7.0)
Eosinophils Absolute: 0.2 10*3/uL (ref 0.0–0.5)
LYMPH#: 1.1 10*3/uL (ref 0.9–3.3)
MCH: 30.7 pg (ref 28.0–33.4)
MONO%: 9.4 % (ref 0.0–13.0)
NEUT#: 2.6 10*3/uL (ref 1.5–6.5)
Platelets: 115 10*3/uL — ABNORMAL LOW (ref 145–400)
RBC: 3.79 10*6/uL — ABNORMAL LOW (ref 4.20–5.70)

## 2008-11-30 LAB — CBC WITH DIFFERENTIAL (CANCER CENTER ONLY)
BASO#: 0 10*3/uL (ref 0.0–0.2)
BASO%: 0.8 % (ref 0.0–2.0)
EOS%: 6 % (ref 0.0–7.0)
HGB: 12.2 g/dL — ABNORMAL LOW (ref 13.0–17.1)
MCH: 29.7 pg (ref 28.0–33.4)
MCHC: 32.5 g/dL (ref 32.0–35.9)
MONO%: 7.3 % (ref 0.0–13.0)
NEUT#: 1.8 10*3/uL (ref 1.5–6.5)
Platelets: 112 10*3/uL — ABNORMAL LOW (ref 145–400)
RDW: 13 % (ref 10.5–14.6)

## 2008-12-17 ENCOUNTER — Ambulatory Visit: Payer: Self-pay | Admitting: Oncology

## 2008-12-21 ENCOUNTER — Encounter: Payer: Self-pay | Admitting: Family Medicine

## 2008-12-21 LAB — CBC WITH DIFFERENTIAL (CANCER CENTER ONLY)
BASO#: 0 10*3/uL (ref 0.0–0.2)
BASO%: 0.5 % (ref 0.0–2.0)
EOS%: 4.9 % (ref 0.0–7.0)
HCT: 35.9 % — ABNORMAL LOW (ref 38.7–49.9)
HGB: 12.3 g/dL — ABNORMAL LOW (ref 13.0–17.1)
LYMPH%: 36.8 % (ref 14.0–48.0)
MCH: 30.6 pg (ref 28.0–33.4)
MCHC: 34.1 g/dL (ref 32.0–35.9)
MCV: 90 fL (ref 82–98)
MONO%: 7.7 % (ref 0.0–13.0)
NEUT%: 50.1 % (ref 40.0–80.0)
RDW: 12.1 % (ref 10.5–14.6)

## 2008-12-21 LAB — IRON AND TIBC
%SAT: 48 % (ref 20–55)
TIBC: 269 ug/dL (ref 215–435)

## 2009-01-07 ENCOUNTER — Ambulatory Visit: Payer: Self-pay | Admitting: Family Medicine

## 2009-01-11 LAB — CBC WITH DIFFERENTIAL (CANCER CENTER ONLY)
BASO#: 0 10*3/uL (ref 0.0–0.2)
HCT: 32.5 % — ABNORMAL LOW (ref 38.7–49.9)
HGB: 10.9 g/dL — ABNORMAL LOW (ref 13.0–17.1)
LYMPH#: 1.7 10*3/uL (ref 0.9–3.3)
LYMPH%: 36.1 % (ref 14.0–48.0)
MCHC: 33.4 g/dL (ref 32.0–35.9)
MCV: 90 fL (ref 82–98)
MONO#: 0.4 10*3/uL (ref 0.1–0.9)
NEUT%: 51.6 % (ref 40.0–80.0)
RDW: 12.8 % (ref 10.5–14.6)
WBC: 4.8 10*3/uL (ref 4.0–10.0)

## 2009-01-22 ENCOUNTER — Encounter: Payer: Self-pay | Admitting: Family Medicine

## 2009-01-28 ENCOUNTER — Ambulatory Visit: Payer: Self-pay | Admitting: Oncology

## 2009-02-02 ENCOUNTER — Encounter: Payer: Self-pay | Admitting: Family Medicine

## 2009-02-22 LAB — CBC WITH DIFFERENTIAL (CANCER CENTER ONLY)
BASO%: 0.5 % (ref 0.0–2.0)
Eosinophils Absolute: 0.2 10*3/uL (ref 0.0–0.5)
HCT: 35.5 % — ABNORMAL LOW (ref 38.7–49.9)
LYMPH#: 2.3 10*3/uL (ref 0.9–3.3)
LYMPH%: 38.7 % (ref 14.0–48.0)
MCV: 89 fL (ref 82–98)
MONO#: 0.5 10*3/uL (ref 0.1–0.9)
NEUT%: 48.6 % (ref 40.0–80.0)
RBC: 3.98 10*6/uL — ABNORMAL LOW (ref 4.20–5.70)
RDW: 13.8 % (ref 10.5–14.6)
WBC: 5.8 10*3/uL (ref 4.0–10.0)

## 2009-03-10 ENCOUNTER — Ambulatory Visit: Payer: Self-pay | Admitting: Family Medicine

## 2009-03-12 ENCOUNTER — Ambulatory Visit: Payer: Self-pay | Admitting: Oncology

## 2009-03-15 ENCOUNTER — Encounter: Payer: Self-pay | Admitting: Cardiovascular Disease

## 2009-03-15 ENCOUNTER — Ambulatory Visit: Payer: Self-pay | Admitting: Cardiovascular Disease

## 2009-03-15 ENCOUNTER — Ambulatory Visit: Payer: Self-pay | Admitting: Family Medicine

## 2009-03-15 LAB — CBC WITH DIFFERENTIAL (CANCER CENTER ONLY)
BASO%: 0.5 % (ref 0.0–2.0)
Eosinophils Absolute: 0.1 10*3/uL (ref 0.0–0.5)
LYMPH#: 1.2 10*3/uL (ref 0.9–3.3)
LYMPH%: 27.2 % (ref 14.0–48.0)
MONO#: 0.3 10*3/uL (ref 0.1–0.9)
NEUT#: 2.8 10*3/uL (ref 1.5–6.5)
Platelets: 123 10*3/uL — ABNORMAL LOW (ref 145–400)
RBC: 3.45 10*6/uL — ABNORMAL LOW (ref 4.20–5.70)
RDW: 14.1 % (ref 10.5–14.6)
WBC: 4.3 10*3/uL (ref 4.0–10.0)

## 2009-03-25 ENCOUNTER — Ambulatory Visit: Payer: Self-pay

## 2009-03-25 ENCOUNTER — Encounter: Payer: Self-pay | Admitting: Cardiovascular Disease

## 2009-03-31 ENCOUNTER — Telehealth: Payer: Self-pay | Admitting: Family Medicine

## 2009-03-31 ENCOUNTER — Ambulatory Visit: Payer: Self-pay | Admitting: Family Medicine

## 2009-04-22 ENCOUNTER — Ambulatory Visit: Payer: Self-pay | Admitting: Oncology

## 2009-04-26 LAB — CBC WITH DIFFERENTIAL (CANCER CENTER ONLY)
BASO#: 0 10*3/uL (ref 0.0–0.2)
EOS%: 3.6 % (ref 0.0–7.0)
Eosinophils Absolute: 0.2 10*3/uL (ref 0.0–0.5)
LYMPH%: 36.9 % (ref 14.0–48.0)
MCH: 31.5 pg (ref 28.0–33.4)
MCHC: 34.5 g/dL (ref 32.0–35.9)
MCV: 91 fL (ref 82–98)
MONO%: 9 % (ref 0.0–13.0)
NEUT#: 3 10*3/uL (ref 1.5–6.5)
Platelets: 131 10*3/uL — ABNORMAL LOW (ref 145–400)

## 2009-05-03 ENCOUNTER — Telehealth: Payer: Self-pay | Admitting: Family Medicine

## 2009-06-03 ENCOUNTER — Ambulatory Visit: Payer: Self-pay | Admitting: Oncology

## 2009-06-08 ENCOUNTER — Encounter: Payer: Self-pay | Admitting: Family Medicine

## 2009-06-08 LAB — IRON AND TIBC
Iron: 132 ug/dL (ref 42–165)
TIBC: 265 ug/dL (ref 215–435)
UIBC: 133 ug/dL

## 2009-06-08 LAB — CBC WITH DIFFERENTIAL (CANCER CENTER ONLY)
BASO#: 0 10*3/uL (ref 0.0–0.2)
EOS%: 4.8 % (ref 0.0–7.0)
Eosinophils Absolute: 0.2 10*3/uL (ref 0.0–0.5)
HCT: 37.5 % — ABNORMAL LOW (ref 38.7–49.9)
HGB: 12.6 g/dL — ABNORMAL LOW (ref 13.0–17.1)
MCH: 31.7 pg (ref 28.0–33.4)
MCHC: 33.7 g/dL (ref 32.0–35.9)
MCV: 94 fL (ref 82–98)
MONO%: 8.4 % (ref 0.0–13.0)
NEUT%: 47.3 % (ref 40.0–80.0)
RBC: 3.98 10*6/uL — ABNORMAL LOW (ref 4.20–5.70)

## 2009-06-08 LAB — BASIC METABOLIC PANEL - CANCER CENTER ONLY
BUN, Bld: 41 mg/dL — ABNORMAL HIGH (ref 7–22)
CO2: 26 mEq/L (ref 18–33)
Chloride: 101 mEq/L (ref 98–108)
Creat: 2.3 mg/dl — ABNORMAL HIGH (ref 0.6–1.2)
Glucose, Bld: 104 mg/dL (ref 73–118)
Potassium: 4.9 mEq/L — ABNORMAL HIGH (ref 3.3–4.7)

## 2009-06-14 ENCOUNTER — Ambulatory Visit: Payer: Self-pay | Admitting: Family Medicine

## 2009-06-21 ENCOUNTER — Telehealth: Payer: Self-pay | Admitting: Family Medicine

## 2009-06-22 ENCOUNTER — Telehealth: Payer: Self-pay | Admitting: Family Medicine

## 2009-06-23 ENCOUNTER — Encounter: Payer: Self-pay | Admitting: Family Medicine

## 2009-06-25 ENCOUNTER — Emergency Department: Payer: Self-pay | Admitting: Emergency Medicine

## 2009-07-15 ENCOUNTER — Ambulatory Visit: Payer: Self-pay | Admitting: Oncology

## 2009-07-19 LAB — CBC WITH DIFFERENTIAL (CANCER CENTER ONLY)
BASO#: 0 10*3/uL (ref 0.0–0.2)
Eosinophils Absolute: 0.2 10*3/uL (ref 0.0–0.5)
HCT: 31.6 % — ABNORMAL LOW (ref 38.7–49.9)
HGB: 10.7 g/dL — ABNORMAL LOW (ref 13.0–17.1)
LYMPH#: 1.6 10*3/uL (ref 0.9–3.3)
MCHC: 34 g/dL (ref 32.0–35.9)
MONO#: 0.4 10*3/uL (ref 0.1–0.9)
NEUT%: 50.3 % (ref 40.0–80.0)
RBC: 3.39 10*6/uL — ABNORMAL LOW (ref 4.20–5.70)
WBC: 4.3 10*3/uL (ref 4.0–10.0)

## 2009-07-21 ENCOUNTER — Encounter: Payer: Self-pay | Admitting: Family Medicine

## 2009-08-23 ENCOUNTER — Ambulatory Visit: Payer: Self-pay | Admitting: Oncology

## 2009-08-30 LAB — CBC WITH DIFFERENTIAL (CANCER CENTER ONLY)
BASO%: 0.6 % (ref 0.0–2.0)
HCT: 34 % — ABNORMAL LOW (ref 38.7–49.9)
LYMPH#: 1.8 10*3/uL (ref 0.9–3.3)
MONO#: 0.4 10*3/uL (ref 0.1–0.9)
NEUT#: 2 10*3/uL (ref 1.5–6.5)
NEUT%: 45.4 % (ref 40.0–80.0)
RDW: 12.5 % (ref 10.5–14.6)
WBC: 4.4 10*3/uL (ref 4.0–10.0)

## 2009-09-08 ENCOUNTER — Ambulatory Visit: Payer: Self-pay | Admitting: Family Medicine

## 2009-09-08 LAB — CONVERTED CEMR LAB
BUN: 25 mg/dL — ABNORMAL HIGH (ref 6–23)
Basophils Absolute: 0 10*3/uL (ref 0.0–0.1)
Basophils Relative: 0.6 % (ref 0.0–3.0)
Calcium: 10.5 mg/dL (ref 8.4–10.5)
Chloride: 111 meq/L (ref 96–112)
Eosinophils Relative: 6.5 % — ABNORMAL HIGH (ref 0.0–5.0)
GFR calc non Af Amer: 38.39 mL/min (ref >60)
Glucose, Bld: 102 mg/dL — ABNORMAL HIGH (ref 70–99)
HCT: 39.4 % (ref 39.0–52.0)
Lymphocytes Relative: 43.6 % (ref 12.0–46.0)
MCV: 99 fL (ref 78.0–100.0)
Microalb Creat Ratio: 19.2 mg/g (ref 0.0–30.0)
Microalb, Ur: 3.5 mg/dL — ABNORMAL HIGH (ref 0.0–1.9)
Monocytes Absolute: 0.5 10*3/uL (ref 0.1–1.0)
Monocytes Relative: 9.6 % (ref 3.0–12.0)
Neutrophils Relative %: 39.7 % — ABNORMAL LOW (ref 43.0–77.0)
Platelets: 144 10*3/uL — ABNORMAL LOW (ref 150.0–400.0)
Potassium: 4.8 meq/L (ref 3.5–5.1)
RDW: 13.4 % (ref 11.5–14.6)
Total Bilirubin: 0.8 mg/dL (ref 0.3–1.2)
Total Protein: 6.3 g/dL (ref 6.0–8.3)

## 2009-09-09 ENCOUNTER — Encounter: Payer: Self-pay | Admitting: Family Medicine

## 2009-09-09 LAB — CONVERTED CEMR LAB: PTH: 140.3 pg/mL — ABNORMAL HIGH (ref 14.0–72.0)

## 2009-09-14 ENCOUNTER — Ambulatory Visit: Payer: Self-pay | Admitting: Family Medicine

## 2009-10-02 DIAGNOSIS — R42 Dizziness and giddiness: Secondary | ICD-10-CM

## 2009-10-02 HISTORY — DX: Dizziness and giddiness: R42

## 2009-10-08 ENCOUNTER — Ambulatory Visit: Payer: Self-pay | Admitting: Oncology

## 2009-10-13 ENCOUNTER — Ambulatory Visit: Payer: Self-pay | Admitting: Family Medicine

## 2009-10-13 LAB — CONVERTED CEMR LAB
OCCULT 1: NEGATIVE
OCCULT 2: NEGATIVE
OCCULT 3: NEGATIVE

## 2009-10-14 ENCOUNTER — Encounter: Payer: Self-pay | Admitting: Family Medicine

## 2009-10-14 LAB — CBC WITH DIFFERENTIAL (CANCER CENTER ONLY)
BASO#: 0 10*3/uL (ref 0.0–0.2)
EOS%: 3.3 % (ref 0.0–7.0)
HGB: 12.7 g/dL — ABNORMAL LOW (ref 13.0–17.1)
LYMPH%: 43.2 % (ref 14.0–48.0)
MCH: 31.5 pg (ref 28.0–33.4)
MCHC: 33.5 g/dL (ref 32.0–35.9)
MCV: 94 fL (ref 82–98)
MONO%: 7 % (ref 0.0–13.0)
NEUT#: 2.2 10*3/uL (ref 1.5–6.5)
NEUT%: 46.2 % (ref 40.0–80.0)

## 2009-10-14 LAB — BASIC METABOLIC PANEL - CANCER CENTER ONLY
BUN, Bld: 39 mg/dL — ABNORMAL HIGH (ref 7–22)
Calcium: 10.9 mg/dL — ABNORMAL HIGH (ref 8.0–10.3)
Creat: 2.2 mg/dl — ABNORMAL HIGH (ref 0.6–1.2)

## 2009-10-14 LAB — CALCIUM, IONIZED: Calcium, Ion: 1.54 mmol/L — ABNORMAL HIGH (ref 1.12–1.32)

## 2009-10-14 LAB — IRON AND TIBC
Iron: 115 ug/dL (ref 42–165)
UIBC: 136 ug/dL

## 2009-10-15 LAB — PTH, INTACT AND CALCIUM
Calcium, Total (PTH): 10.6 mg/dL — ABNORMAL HIGH (ref 8.4–10.5)
PTH: 171.1 pg/mL — ABNORMAL HIGH (ref 14.0–72.0)

## 2009-10-18 ENCOUNTER — Encounter (INDEPENDENT_AMBULATORY_CARE_PROVIDER_SITE_OTHER): Payer: Self-pay | Admitting: *Deleted

## 2009-11-19 ENCOUNTER — Ambulatory Visit: Payer: Self-pay | Admitting: Oncology

## 2009-11-23 ENCOUNTER — Encounter: Payer: Self-pay | Admitting: Family Medicine

## 2009-11-23 LAB — CMP (CANCER CENTER ONLY)
ALT(SGPT): 17 U/L (ref 10–47)
AST: 20 U/L (ref 11–38)
Albumin: 3.5 g/dL (ref 3.3–5.5)
BUN, Bld: 33 mg/dL — ABNORMAL HIGH (ref 7–22)
Calcium: 10.5 mg/dL — ABNORMAL HIGH (ref 8.0–10.3)
Chloride: 101 mEq/L (ref 98–108)
Potassium: 4.8 mEq/L — ABNORMAL HIGH (ref 3.3–4.7)

## 2009-11-23 LAB — CBC WITH DIFFERENTIAL (CANCER CENTER ONLY)
BASO#: 0 10*3/uL (ref 0.0–0.2)
EOS%: 4.5 % (ref 0.0–7.0)
HGB: 12.1 g/dL — ABNORMAL LOW (ref 13.0–17.1)
LYMPH#: 1.6 10*3/uL (ref 0.9–3.3)
MCHC: 34.5 g/dL (ref 32.0–35.9)
NEUT#: 2.2 10*3/uL (ref 1.5–6.5)
RBC: 3.84 10*6/uL — ABNORMAL LOW (ref 4.20–5.70)
WBC: 4.2 10*3/uL (ref 4.0–10.0)

## 2010-01-07 ENCOUNTER — Ambulatory Visit: Payer: Self-pay | Admitting: Family Medicine

## 2010-01-10 ENCOUNTER — Ambulatory Visit: Payer: Self-pay | Admitting: Family Medicine

## 2010-01-11 ENCOUNTER — Ambulatory Visit: Payer: Self-pay | Admitting: Family Medicine

## 2010-01-11 ENCOUNTER — Telehealth: Payer: Self-pay | Admitting: Family Medicine

## 2010-01-11 ENCOUNTER — Encounter: Payer: Self-pay | Admitting: Family Medicine

## 2010-01-11 HISTORY — PX: OTHER SURGICAL HISTORY: SHX169

## 2010-01-12 ENCOUNTER — Encounter: Payer: Self-pay | Admitting: Family Medicine

## 2010-01-13 ENCOUNTER — Ambulatory Visit: Payer: Self-pay | Admitting: Family Medicine

## 2010-01-25 ENCOUNTER — Ambulatory Visit: Payer: Self-pay | Admitting: Family Medicine

## 2010-02-01 ENCOUNTER — Ambulatory Visit: Payer: Self-pay | Admitting: Family Medicine

## 2010-02-16 ENCOUNTER — Ambulatory Visit: Payer: Self-pay | Admitting: Oncology

## 2010-02-17 ENCOUNTER — Encounter: Payer: Self-pay | Admitting: Family Medicine

## 2010-02-17 LAB — CONVERTED CEMR LAB
BUN: 32 mg/dL — ABNORMAL HIGH (ref 6–23)
Chloride: 105 meq/L (ref 96–112)
Chloride: 102 meq/L (ref 96–112)
Creatinine, Ser: 2.2 mg/dL — ABNORMAL HIGH (ref 0.4–1.5)
GFR calc non Af Amer: 25.09 mL/min (ref >60)
GFR calc non Af Amer: 30.43 mL/min (ref >60)
Glucose, Bld: 120 mg/dL — ABNORMAL HIGH (ref 70–99)
Potassium: 5 meq/L (ref 3.5–5.1)
Sodium: 134 meq/L — ABNORMAL LOW (ref 135–145)

## 2010-02-22 ENCOUNTER — Encounter: Payer: Self-pay | Admitting: Family Medicine

## 2010-02-22 LAB — CBC WITH DIFFERENTIAL (CANCER CENTER ONLY)
BASO%: 0.5 % (ref 0.0–2.0)
EOS%: 2.5 % (ref 0.0–7.0)
LYMPH#: 1.2 10*3/uL (ref 0.9–3.3)
MCHC: 34.7 g/dL (ref 32.0–35.9)
NEUT#: 2.7 10*3/uL (ref 1.5–6.5)
NEUT%: 61.8 % (ref 40.0–80.0)
Platelets: 127 10*3/uL — ABNORMAL LOW (ref 145–400)
RDW: 11.5 % (ref 10.5–14.6)

## 2010-03-14 ENCOUNTER — Ambulatory Visit: Payer: Self-pay | Admitting: Family Medicine

## 2010-03-14 LAB — CONVERTED CEMR LAB
Albumin: 3.8 g/dL (ref 3.5–5.2)
Chloride: 112 meq/L (ref 96–112)
GFR calc non Af Amer: 36.46 mL/min (ref >60)
Sodium: 142 meq/L (ref 135–145)

## 2010-03-17 ENCOUNTER — Ambulatory Visit: Payer: Self-pay | Admitting: Family Medicine

## 2010-04-21 ENCOUNTER — Telehealth: Payer: Self-pay | Admitting: Family Medicine

## 2010-04-21 ENCOUNTER — Encounter: Payer: Self-pay | Admitting: Family Medicine

## 2010-05-03 ENCOUNTER — Encounter: Payer: Self-pay | Admitting: Family Medicine

## 2010-05-03 ENCOUNTER — Telehealth: Payer: Self-pay | Admitting: Family Medicine

## 2010-05-04 ENCOUNTER — Telehealth: Payer: Self-pay | Admitting: Family Medicine

## 2010-05-05 ENCOUNTER — Telehealth: Payer: Self-pay | Admitting: Family Medicine

## 2010-05-06 ENCOUNTER — Ambulatory Visit: Payer: Self-pay | Admitting: Family Medicine

## 2010-05-06 DIAGNOSIS — R110 Nausea: Secondary | ICD-10-CM | POA: Insufficient documentation

## 2010-05-10 ENCOUNTER — Encounter (INDEPENDENT_AMBULATORY_CARE_PROVIDER_SITE_OTHER): Payer: Self-pay | Admitting: *Deleted

## 2010-05-11 ENCOUNTER — Telehealth: Payer: Self-pay | Admitting: Family Medicine

## 2010-05-13 ENCOUNTER — Telehealth: Payer: Self-pay | Admitting: Family Medicine

## 2010-05-13 DIAGNOSIS — R42 Dizziness and giddiness: Secondary | ICD-10-CM | POA: Insufficient documentation

## 2010-05-18 ENCOUNTER — Encounter: Payer: Self-pay | Admitting: Family Medicine

## 2010-05-18 ENCOUNTER — Ambulatory Visit: Payer: Self-pay | Admitting: Family Medicine

## 2010-05-18 ENCOUNTER — Encounter (INDEPENDENT_AMBULATORY_CARE_PROVIDER_SITE_OTHER): Payer: Self-pay | Admitting: *Deleted

## 2010-06-01 ENCOUNTER — Ambulatory Visit: Payer: Self-pay | Admitting: Oncology

## 2010-06-07 ENCOUNTER — Encounter: Payer: Self-pay | Admitting: Family Medicine

## 2010-06-07 LAB — CBC WITH DIFFERENTIAL (CANCER CENTER ONLY)
BASO#: 0 10*3/uL (ref 0.0–0.2)
EOS%: 2.3 % (ref 0.0–7.0)
HGB: 10.5 g/dL — ABNORMAL LOW (ref 13.0–17.1)
LYMPH#: 1.5 10*3/uL (ref 0.9–3.3)
MCHC: 35.6 g/dL (ref 32.0–35.9)
NEUT#: 2.4 10*3/uL (ref 1.5–6.5)
Platelets: 125 10*3/uL — ABNORMAL LOW (ref 145–400)
RBC: 3.25 10*6/uL — ABNORMAL LOW (ref 4.20–5.70)

## 2010-06-07 LAB — CMP (CANCER CENTER ONLY)
ALT(SGPT): 14 U/L (ref 10–47)
AST: 18 U/L (ref 11–38)
Albumin: 3.8 g/dL (ref 3.3–5.5)
Alkaline Phosphatase: 92 U/L — ABNORMAL HIGH (ref 26–84)
BUN, Bld: 32 mg/dL — ABNORMAL HIGH (ref 7–22)
Calcium: 10.2 mg/dL (ref 8.0–10.3)
Chloride: 98 mEq/L (ref 98–108)
Potassium: 4.8 mEq/L — ABNORMAL HIGH (ref 3.3–4.7)
Sodium: 132 mEq/L (ref 128–145)
Total Protein: 6.1 g/dL — ABNORMAL LOW (ref 6.4–8.1)

## 2010-06-08 ENCOUNTER — Encounter: Payer: Self-pay | Admitting: Family Medicine

## 2010-07-02 ENCOUNTER — Encounter: Payer: Self-pay | Admitting: Family Medicine

## 2010-07-06 ENCOUNTER — Encounter: Payer: Self-pay | Admitting: Family Medicine

## 2010-07-07 ENCOUNTER — Ambulatory Visit: Payer: Self-pay | Admitting: Family Medicine

## 2010-07-13 ENCOUNTER — Ambulatory Visit: Payer: Self-pay | Admitting: Internal Medicine

## 2010-08-02 ENCOUNTER — Ambulatory Visit: Payer: Self-pay | Admitting: Internal Medicine

## 2010-08-18 ENCOUNTER — Encounter: Payer: Self-pay | Admitting: Family Medicine

## 2010-09-29 ENCOUNTER — Encounter: Payer: Self-pay | Admitting: Family Medicine

## 2010-09-29 ENCOUNTER — Telehealth: Payer: Self-pay | Admitting: Family Medicine

## 2010-10-06 ENCOUNTER — Ambulatory Visit
Admission: RE | Admit: 2010-10-06 | Discharge: 2010-10-06 | Payer: Self-pay | Source: Home / Self Care | Attending: Family Medicine | Admitting: Family Medicine

## 2010-10-06 ENCOUNTER — Other Ambulatory Visit: Payer: Self-pay | Admitting: Family Medicine

## 2010-10-06 ENCOUNTER — Encounter: Payer: Self-pay | Admitting: Family Medicine

## 2010-10-06 LAB — HEPATIC FUNCTION PANEL
ALT: 13 U/L (ref 0–53)
AST: 17 U/L (ref 0–37)
Albumin: 3.6 g/dL (ref 3.5–5.2)
Alkaline Phosphatase: 73 U/L (ref 39–117)
Bilirubin, Direct: 0.1 mg/dL (ref 0.0–0.3)
Total Bilirubin: 0.9 mg/dL (ref 0.3–1.2)
Total Protein: 5.8 g/dL — ABNORMAL LOW (ref 6.0–8.3)

## 2010-10-06 LAB — BASIC METABOLIC PANEL
BUN: 24 mg/dL — ABNORMAL HIGH (ref 6–23)
CO2: 23 mEq/L (ref 19–32)
Calcium: 10.1 mg/dL (ref 8.4–10.5)
Chloride: 103 mEq/L (ref 96–112)
Creatinine, Ser: 1.9 mg/dL — ABNORMAL HIGH (ref 0.4–1.5)
GFR: 37.1 mL/min — ABNORMAL LOW (ref >60.00)
Glucose, Bld: 104 mg/dL — ABNORMAL HIGH (ref 70–99)
Potassium: 4.5 mEq/L (ref 3.5–5.1)
Sodium: 134 mEq/L — ABNORMAL LOW (ref 135–145)

## 2010-10-06 LAB — CONVERTED CEMR LAB: Vit D, 25-Hydroxy: 30 ng/mL (ref 30–89)

## 2010-10-06 LAB — LIPID PANEL
Cholesterol: 182 mg/dL (ref 0–200)
HDL: 38.1 mg/dL — ABNORMAL LOW (ref >39.00)
LDL Cholesterol: 124 mg/dL — ABNORMAL HIGH (ref 0–99)
Total CHOL/HDL Ratio: 5
Triglycerides: 98 mg/dL (ref 0.0–149.0)
VLDL: 19.6 mg/dL (ref 0.0–40.0)

## 2010-10-06 LAB — HEMOGLOBIN A1C: Hgb A1c MFr Bld: 5.7 % (ref 4.6–6.5)

## 2010-10-10 ENCOUNTER — Encounter: Payer: Self-pay | Admitting: Family Medicine

## 2010-10-10 ENCOUNTER — Ambulatory Visit
Admission: RE | Admit: 2010-10-10 | Discharge: 2010-10-10 | Payer: Self-pay | Source: Home / Self Care | Attending: Family Medicine | Admitting: Family Medicine

## 2010-10-18 ENCOUNTER — Ambulatory Visit
Admission: RE | Admit: 2010-10-18 | Discharge: 2010-10-18 | Payer: Self-pay | Source: Home / Self Care | Attending: Family Medicine | Admitting: Family Medicine

## 2010-10-18 ENCOUNTER — Encounter (INDEPENDENT_AMBULATORY_CARE_PROVIDER_SITE_OTHER): Payer: Self-pay | Admitting: *Deleted

## 2010-10-18 ENCOUNTER — Other Ambulatory Visit: Payer: Self-pay | Admitting: Family Medicine

## 2010-10-18 LAB — FECAL OCCULT BLOOD, IMMUNOCHEMICAL: Fecal Occult Bld: NEGATIVE

## 2010-11-01 NOTE — Letter (Signed)
Summary: Results Follow up Letter  [place] [facility]  [address] [place], [place] [address]   Phone: [phone]  Fax: [phone]    10/18/2009 MRN: [id number]  Timothy Ramos [address] [place], [place]  [address]  Dear Mr. Zandi,  The following are the results of your recent test(s):  Test         Result    Pap Smear:        Normal _____  Not Normal _____ Comments: ______________________________________________________ Cholesterol: LDL(Bad cholesterol):         Your goal is less than:         HDL (Good cholesterol):       Your goal is more than: Comments:  ______________________________________________________ Mammogram:        Normal _____  Not Normal _____ Comments:  ___________________________________________________________________ Hemoccult:        Normal _X____  Not normal _______ Comments:  Please repeat in one year.  _____________________________________________________________________ Other Tests:    We routinely do not discuss normal results over the telephone.  If you desire a copy of the results, or you have any questions about this information we can discuss them at your next office visit.   Sincerely,     Laurita Quint, MD

## 2010-11-01 NOTE — Assessment & Plan Note (Signed)
Summary: F/U,FLU SHOT/CLE   Vital Signs:  Patient profile:   77 year old male Height:      68.5 inches Weight:      165.50 pounds BMI:     24.89 Temp:     97.6 degrees F oral Pulse rate:   72 / minute Pulse rhythm:   regular BP sitting:   132 / 48  (left arm) Cuff size:   regular  Vitals Entered By: Delilah Shan CMA Duncan Dull) (July 07, 2010 8:27 AM) CC: follow-up visit   History of Present Illness: Dizzy sensation is some better. No nausea with getting up and walking is better.  "But the dizzy thing is still there.  I just don't focus right or feel right."  Present intermittently.  Less symptoms at night, which is improved.  Sx continue to be present in the AM, usually better as the morning goes along.  Was released from physical therapy at home, but continues to do exercises.  No prev change in symptoms with decrease in BP meds.   H/o uro follow up for BPH on proscar and uroxatral.  Was told by Dr. Patsi Sears that he didn't have prostate CA.  Asking about tapering meds and establishing with uro in [place] due to distanc/travel concerns.  H/o anemia.  Dr. Welton Flakes is relocating to [facility].   Pt would rather go to [place].  See plan.   Allergies: 1)  ! Penicillin G Potassium 2)  ! Maxzide-25 (Triamterene-Hctz) 3)  ! Cardura 4)  ! 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 Welton Flakes with heme intermittent vertigo 2011, better with physical therapy  Review of Systems       See HPI.  Otherwise negative.    Physical Exam  General:  GEN: nad, alert and oriented, hard of hearing HEENT: mucous membranes moist, TMs wnl NECK: supple w/o LA CV: rrr.  PULM: ctab, no inc wob ABD: soft, +bs EXT: no edema SKIN: no acute rash  CN 2-12 wnl B, S/S wnl x4   Impression & Recommendations:  Problem # 1:  DIZZINESS (ICD-780.4) >25 min  spent with patient, at least half of which was spent on counseling re: dx.  I would continue home exercises.  He prev had MRI and this was unremarkable.  I wouldn't change meds at this point- he had no relief with change of BP meds.  he agrees.   Problem # 2:  HYPERTROPHY PROSTATE BNG W/URINARY OBST/LUTS (ICD-600.01) Refer to uro in [place].  No change in meds.  Orders: Urology Referral (Urology)  Problem # 3:  ANEMIA NOS (ICD-285.9) Refer to heme in [place].  He agrees.  His updated medication list for this problem includes:    Ferro-bob 325 (65 Fe) Mg Tabs (Ferrous sulfate) .Marland Kitchen... Take one by mouth daily  Orders: Hematology Referral (Hematology)  Complete Medication List: 1)  Ferro-bob 325 (65 Fe) Mg Tabs (Ferrous sulfate) .... Take one by mouth daily 2)  Aspir-low 81 Mg Tbec (Aspirin) .Marland Kitchen.. 1daily by mouth 3)  Verapamil Hcl Cr 240 Mg Cp24 (Verapamil hcl) .Marland Kitchen.. 1 tablet  daily by mouth 4)  Proscar 5 Mg Tabs (Finasteride) .... One tab by mouth every day 5)  Uroxatral 10 Mg Tb24 (Alfuzosin hcl) .Marland Kitchen.. 1 daily by mouth 6)  Accu-chek Aviva Strp (Glucose blood) .... Check blood sugar one to two times a week 7)  Lisinopril 20 Mg Tabs (  Lisinopril) .... Take one by mouth daily 8)  Vitamin D 1000 Unit Tabs (Cholecalciferol) .... Take 1 tablet by mouth once a day  Other Orders: Flu Vaccine 41yrs + MEDICARE PATIENTS (F6433) Administration Flu vaccine - MCR (I9518)  Patient Instructions: 1)  Keep doing your exercise and see Shirlee Limerick about your referral before your leave today.  Take care.  I would like to see you back in 3 months to check up on you blood pressure.  Take care.   Current Allergies (reviewed today): ! PENICILLIN G POTASSIUM ! MAXZIDE-25 (TRIAMTERENE-HCTZ) ! CARDURA ! ZOCOR    Flu Vaccine Consent Questions     Do you have a history of severe allergic reactions to this vaccine? no    Any prior history of allergic reactions to egg and/or gelatin? no    Do you have a  sensitivity to the preservative Thimersol? no    Do you have a past history of Guillan-Barre Syndrome? no    Do you currently have an acute febrile illness? no    Have you ever had a severe reaction to latex? no    Vaccine information given and explained to patient? yes    Are you currently pregnant? no    Lot Number:[id number]   Exp Date:04/01/2011   Site Given  Left Deltoid IMdflu [name] CMA (AAMA)  July 07, 2010 8:35 AM

## 2010-11-01 NOTE — Progress Notes (Signed)
Summary: Update since decreasing dose of Verapamil  Phone Note Call from Patient Call back at Home Phone [phone]   Caller: Patient Call For: Timothy Givens MD Summary of Call: Patient was told by Dr. Para March to call today and update [place] on how he is doing since the Verapamil 240mg  dose was cut in half.  Patient states that some days he is very weak and can hardly stand up, this feeling usually does not get any better as the day goes on.  He often feels very dizzy and has a headache that lingers.  Please advise. Initial call taken by: Linde Gillis CMA Fleda Pagel Dull),  May 13, 2010 9:19 AM  Follow-up for Phone Call        I called the patient.  At this point, I would proceed with noncontrast MRI of brain.  His symptoms continue to wax and wane.  Advised patient to keep meds as they are.  He requests [place] for imaging due to travel distance.  Follow-up by: Timothy Givens MD,  May 13, 2010 11:09 AM  New Problems: DIZZINESS (ICD-780.4)   New Problems: DIZZINESS (ICD-780.4)    Impression & Recommendations:  Problem # 1:  DIZZINESS (ICD-780.4)  Refer for MRI and will contact patient after that.   Orders: Radiology Referral (Radiology)  Complete Medication List: 1)  Ferro-bob 325 (65 Fe) Mg Tabs (Ferrous sulfate) .... Take one by mouth daily 2)  Aspir-low 81 Mg Tbec (Aspirin) .Marland Kitchen.. 1daily by mouth 3)  Verapamil Hcl Cr 240 Mg Cp24 (Verapamil hcl) .Marland Kitchen.. 1 tablet  daily by mouth 4)  Proscar 5 Mg Tabs (Finasteride) .... One tab by mouth every day 5)  Uroxatral 10 Mg Tb24 (Alfuzosin hcl) .Marland Kitchen.. 1 daily by mouth 6)  Accu-chek Aviva Strp (Glucose blood) .... Check blood sugar one to two times a week 7)  Lisinopril 20 Mg Tabs (Lisinopril) .... Take one by mouth daily 8)  Vitamin D 1000 Unit Tabs (Cholecalciferol) .... Take 1 tablet by mouth once a day

## 2010-11-01 NOTE — Letter (Signed)
Summary: Eye Examination Report,Frank Spaeth,O.D.  Eye Examination Report,Frank Spaeth,O.D.   Imported By: Beau Fanny 02/08/2010 16:27:35  _____________________________________________________________________  External Attachment:    Type:   Image     Comment:   External Document  Appended Document: Eye Examination Report,Frank Spaeth,O.D.     Clinical Lists Changes  Observations: Added new observation of DMEYEEXAMNXT: 01/2011 (02/08/2010 17:33) Added new observation of DMEYEEXMRES: normal (01/12/2010 17:34) Added new observation of EYE EXAM BY: DrSpaeth (01/12/2010 17:34) Added new observation of DIAB EYE EX: normal (01/12/2010 17:34)        Diabetes Management Exam:    Eye Exam:       Eye Exam done elsewhere          Date: 01/12/2010          Results: normal          Done by: Everlean Cherry

## 2010-11-01 NOTE — Assessment & Plan Note (Signed)
Summary: Follow up/lsf   Vital Signs:  Patient profile:   77 year old male Height:      68.5 inches Weight:      164.38 pounds Temp:     98.3 degrees F oral Pulse rate:   80 / minute Pulse rhythm:   regular BP sitting:   124 / 60  (left arm) Cuff size:   regular  Vitals Entered By: Delilah Shan CMA Duncan Dull) (January 10, 2010 11:33 AM) CC: follow-up visit   History of Present Illness: 77 yo here for follow up diarrhea. Saw him on 4/8, at that time was having diarrhea, nausea and vomiting. Symptoms appeared to be improving when I saw him. Has baseline renal insufficiency but Cr was elevated to 2.6 (had been around 1.8) in the setting of elevated BUN as well.  LIkely consistent with dehydration, advised to hold his ACEI and follow up today.  Today, still have nausea and diarrhea, no vomiting.  Does have some vague abdominal pain after eating.  No fevers.  Trying to drink a lot of fluids.  Current Medications (verified): 1)  Ferro-Bob 325 (65 Fe) Mg Tabs (Ferrous Sulfate) .... Take One By Mouth Daily 2)  Aspir-Low 81 Mg Tbec (Aspirin) .Marland Kitchen.. 1daily By Mouth 3)  Verapamil Hcl Cr 240 Mg  Cp24 (Verapamil Hcl) .Marland Kitchen.. 1 Tablet  Daily By Mouth 4)  Proscar 5 Mg  Tabs (Finasteride) .... One Tab By Mouth Every Day 5)  Uroxatral 10 Mg  Tb24 (Alfuzosin Hcl) .Marland Kitchen.. 1 Daily By Mouth 6)  Shot At  Clinic 7)  Accu-Chek Aviva  Strp (Glucose Blood) .... Check Blood Sugar One To Two Times A Week 8)  Flax Seeds  Powd (Flaxseed (Linseed)) .... Take One Teaspoon Daily 9)  Vitamin B-12 500 Mcg  Tabs (Cyanocobalamin) .... Take 1 Tablet By Mouth Once A Day  Allergies: 1)  ! Penicillin G Potassium 2)  ! Maxzide-25 (Triamterene-Hctz) 3)  ! Cardura  Review of Systems      See HPI General:  Denies chills and fever. GI:  Complains of diarrhea and nausea; denies dark tarry stools and vomiting.  Physical Exam  General:  Well-developed,well-nourished,in no acute distress; alert,appropriate and cooperative  throughout examination, NAD. Abdomen:  Bowel sounds positive,abdomen soft and non-tender without masses, organomegaly or hernias noted. Psych:  Cognition and judgment appear intact. Alert and cooperative with normal attention span and concentration. No apparent delusions, illusions, hallucinations   Impression & Recommendations:  Problem # 1:  GASTROENTERITIS, ACUTE (ICD-558.9) Assessment Unchanged Not improved. Will get ultrasound of abdomen to rule out other pathology. Is able to eat and drink, not requiring hospitalization at this time.  Orders: Radiology Referral (Radiology)  Problem # 2:  DISORDER, KIDNEY NOS (ICD-593.9) Assessment: Deteriorated Likely from dehydration.  Advised to continue holding LIsinopril for now, will recheck BMET today. Orders: Venipuncture ([address]) TLB-BMP (Basic Metabolic Panel-BMET) (80048-METABOL)  Complete Medication List: 1)  Ferro-bob 325 (65 Fe) Mg Tabs (Ferrous sulfate) .... Take one by mouth daily 2)  Aspir-low 81 Mg Tbec (Aspirin) .Marland Kitchen.. 1daily by mouth 3)  Verapamil Hcl Cr 240 Mg Cp24 (Verapamil hcl) .Marland Kitchen.. 1 tablet  daily by mouth 4)  Proscar 5 Mg Tabs (Finasteride) .... One tab by mouth every day 5)  Uroxatral 10 Mg Tb24 (Alfuzosin hcl) .Marland Kitchen.. 1 daily by mouth 6)  Shot At Clinic  7)  Accu-chek Aviva Strp (Glucose blood) .... Check blood sugar one to two times a week 8)  Flax Seeds Powd (Flaxseed (linseed)) .Marland KitchenMarland KitchenMarland Kitchen  Take one teaspoon daily 9)  Vitamin B-12 500 Mcg Tabs (Cyanocobalamin) .... Take 1 tablet by mouth once a day  Patient Instructions: 1)  Good to see you, Mr. Mcphee. 2)  Keep drinking a lot of fluids. 3)  We will call you tomorrow about restarting your blood pressure medication. 4)  Please stop by to see Shirlee Limerick on your way out to set up your ultrasound.  Current Allergies (reviewed today): ! PENICILLIN G POTASSIUM ! MAXZIDE-25 (TRIAMTERENE-HCTZ) ! CARDURA

## 2010-11-01 NOTE — Assessment & Plan Note (Signed)
Summary: STOMACH PROBLEMS/NT   Vital Signs:  Patient profile:   77 year old male Weight:      163.25 pounds Temp:     98.7 degrees F oral Pulse rate:   76 / minute Pulse rhythm:   regular BP sitting:   132 / 68  (left arm) Cuff size:   regular  Vitals Entered By: Sydell Axon LPN (January 25, 2010 12:01 PM) CC: follow-up on stomach problems   History of Present Illness: Pt here for continued stomach upset. He has hiccups, belching  and gas. These things come and go...he sees no relationship to what he eats. His sxs continued to improve after seeing me but then plateaued. He has a sour taste in his stomach and has little appetite.   Problems Prior to Update: 1)  Gastroenteritis, Acute  (ICD-558.9) 2)  Dizziness  (ICD-780.4) 3)  Special Screening Malig Neoplasms Other Sites  (ICD-V76.49) 4)  Osteopenia  (ICD-733.90) 5)  Vitamin D Deficiency  (ICD-268.9) 6)  Hearing Loss  (ICD-389.9) 7)  Hiatal Hernia  (ICD-553.3) 8)  Gastritis, Antral  (ICD-535.40) 9)  Cad  (ICD-414.00) 10)  Dyslipidemia  (ICD-272.4) 11)  Anemia Nos  (ICD-285.9) 12)  Hypertension, Benign  (ICD-401.1) 13)  Fatigue, Acute  (ICD-780.79) 14)  Hypertrophy Prostate Bng W/urinary Obst/luts  (ICD-600.01) 15)  Elevated C-reactive Protein  (ICD-790.95) 16)  Disorder, Kidney Nos  (ICD-593.9) 17)  Nocturia  (ICD-788.43) 18)  Hyperparathyroidism, Secondary  (ICD-588.81) 19)  Allergy  (ICD-995.3) 20)  Diabetes Mellitus, Type II  (ICD-250.00)  Medications Prior to Update: 1)  Ferro-Bob 325 (65 Fe) Mg Tabs (Ferrous Sulfate) .... Take One By Mouth Daily 2)  Aspir-Low 81 Mg Tbec (Aspirin) .Marland Kitchen.. 1daily By Mouth 3)  Verapamil Hcl Cr 240 Mg  Cp24 (Verapamil Hcl) .Marland Kitchen.. 1 Tablet  Daily By Mouth 4)  Proscar 5 Mg  Tabs (Finasteride) .... One Tab By Mouth Every Day 5)  Uroxatral 10 Mg  Tb24 (Alfuzosin Hcl) .Marland Kitchen.. 1 Daily By Mouth 6)  Shot At  Clinic 7)  Accu-Chek Aviva  Strp (Glucose Blood) .... Check Blood Sugar One To Two Times A  Week 8)  Vitamin B-12 500 Mcg  Tabs (Cyanocobalamin) .... Take 1 Tablet By Mouth Once A Day 9)  Lisinopril 20 Mg Tabs (Lisinopril) .... Take One By Mouth Daily  Allergies: 1)  ! Penicillin G Potassium 2)  ! Maxzide-25 (Triamterene-Hctz) 3)  ! Cardura  Physical Exam  General:  Well-developed,well-nourished,in no acute distress; alert,appropriate and cooperative throughout examination, NAD. Has fetid breath. Head:  Normocephalic and atraumatic without obvious abnormalities. No apparent alopecia or balding. Sinuses NT. Eyes:  pupils equal, pupils round, and no nystagmus.  EOMs full, Conjunctiva clear bilaterally.  Ears:  External ear exam shows no significant lesions or deformities.  Otoscopic examination reveals clear canals, tympanic membranes are intact bilaterally without bulging, retraction, inflammation or discharge. Hearing is grossly normal bilaterally. L ear with hearing aid. Nose:  External nasal examination shows no deformity or inflammation. Nasal mucosa are pink and moist without lesions or exudates. Mouth:  Oral mucosa and oropharynx without lesions or exudates.  Teeth in good repair. MMM Neck:  no JVD and no carotid bruits.   Chest Wall:  No deformities, masses, tenderness or gynecomastia noted. Lungs:  Normal respiratory effort, chest expands symmetrically. Lungs are clear to auscultation, no crackles or wheezes. Heart:  Normal rate and regular rhythm. S1 and S2 normal without gallop, murmur, click, rub or other extra sounds. No abnml heard. Abdomen:  Bowel sounds positive and very active but benign sounding, abdomen soft and non-tender without masses, organomegaly or hernias noted. No tympany noted.   Impression & Recommendations:  Problem # 1:  GASTROENTERITIS, ACUTE (ICD-558.9) Assessment New   Has fetid breath. Does not clinically look like sinus infection or lung abscess. Could be H.Pylori infection . Could be GERD with anaerobic bowel infection.  Will try home remedy  GERD trmt and use Flagyl to try to eradicate anaerobic bacteria.  Take Flagyl for one week. Take 3 oz of Jogging in a Jug. Will see back.  Discussed use of medication and role of diet. Encouraged clear liquids and electrolyte replacement fluids. Instructed to call if any signs of worsening dehydration.   Complete Medication List: 1)  Ferro-bob 325 (65 Fe) Mg Tabs (Ferrous sulfate) .... Take one by mouth daily 2)  Aspir-low 81 Mg Tbec (Aspirin) .Marland Kitchen.. 1daily by mouth 3)  Verapamil Hcl Cr 240 Mg Cp24 (Verapamil hcl) .Marland Kitchen.. 1 tablet  daily by mouth 4)  Proscar 5 Mg Tabs (Finasteride) .... One tab by mouth every day 5)  Uroxatral 10 Mg Tb24 (Alfuzosin hcl) .Marland Kitchen.. 1 daily by mouth 6)  Shot At Clinic  7)  Accu-chek Aviva Strp (Glucose blood) .... Check blood sugar one to two times a week 8)  Vitamin B-12 500 Mcg Tabs (Cyanocobalamin) .... Take 1 tablet by mouth once a day 9)  Lisinopril 20 Mg Tabs (Lisinopril) .... Take one by mouth daily 10)  Flagyl 250 Mg Tabs (Metronidazole) .... One tab by mouth 4 times a day  Patient Instructions: 1)  RTC one week. 2)  Take Flagyl four times a day. 3)  Take 3oz Jogging in a Jug 4)  May need GI for H.Pylori eval. 5)  ?Poss lung infection? Prescriptions: FLAGYL 250 MG TABS (METRONIDAZOLE) one tab by mouth 4 times a day  #28 x 0   Entered and Authorized by:   Shaune Leeks MD   Signed by:   Shaune Leeks MD on 01/25/2010   Method used:   Electronically to        [facility]  [address] (retail)       [address], [address]       [place], [place]  [address]       Ph: [phone]       Fax: [phone]   RxID:   [id number]   Current Allergies (reviewed today): ! PENICILLIN G POTASSIUM ! MAXZIDE-25 (TRIAMTERENE-HCTZ) ! CARDURA  Appended Document: STOMACH PROBLEMS/NT Will have to watch his sugar.

## 2010-11-01 NOTE — Progress Notes (Signed)
  Phone Note Call from Patient   Caller: Patient Summary of Call: Patient came to the office in person to ask if we would refer him to  a specialist for his dizzy spells he is having. He describes them like motion sickness and he is having them every day. Told him you might want to see him in the office before you would refer but he asked me to write this up anyway. He says he had a complete workup from a heart Dr already. Would like someone in the [place] area.  Initial call taken by: Carlton Adam,  May 04, 2010 4:20 PM  Follow-up for Phone Call        He needs an appointment.  I don't know where to send him without seeing him.  And we may be able to do some things before he goes to the specialist that could help him.  Follow-up by: Crawford Givens MD,  May 04, 2010 8:31 PM  Additional Follow-up for Phone Call Additional follow up Details #1::        No answer, no VM [name] CMA Duncan Dull)  May 05, 2010 9:32 AM  Patient Advised.  Additional Follow-up by: Delilah Shan CMA (AAMA),  May 05, 2010 10:10 AM

## 2010-11-01 NOTE — Miscellaneous (Signed)
   Clinical Lists Changes  Observations: Added new observation of ZOSTAVAX: Zostavax (04/21/2010 15:42)      Immunization History:  Zostavax History:    Zostavax # 1:  Zostavax (04/21/2010)

## 2010-11-01 NOTE — Assessment & Plan Note (Signed)
Summary: FOLLOW-UP FOR STOMACH   Vital Signs:  Patient profile:   77 year old male Weight:      166.25 pounds Temp:     98.2 degrees F oral Pulse rate:   72 / minute Pulse rhythm:   regular BP sitting:   132 / 66  (left arm) Cuff size:   regular  Vitals Entered By: Sydell Axon LPN (January 13, 2010 3:35 PM) CC: Follow-up opn stomach problem   History of Present Illness: Pt had eye appt yesterday. No diabetic retinopathy. WPt here because he was seen for diarrhea and had blood drawn. Then had blood drawn again. Then wanted U/S done which he had done Mon. Then she wanted more blood drawn. Then sent back to see me. He has been agitated by the amount of investigation being done.  Clinically, after the diarrhea that he had , he had some vomitting also. That then improved slowly and he has now improved to mild nausea yesterday and  no probs today of abd discomfort, nausea, diarrhea or vomiting. He actually feels pretty good and would really like an explanation of what was done and why.  Problems Prior to Update: 1)  Gastroenteritis, Acute  (ICD-558.9) 2)  Dizziness  (ICD-780.4) 3)  Special Screening Malig Neoplasms Other Sites  (ICD-V76.49) 4)  Osteopenia  (ICD-733.90) 5)  Vitamin D Deficiency  (ICD-268.9) 6)  Hearing Loss  (ICD-389.9) 7)  Hiatal Hernia  (ICD-553.3) 8)  Gastritis, Antral  (ICD-535.40) 9)  Cad  (ICD-414.00) 10)  Dyslipidemia  (ICD-272.4) 11)  Anemia Nos  (ICD-285.9) 12)  Hypertension, Benign  (ICD-401.1) 13)  Fatigue, Acute  (ICD-780.79) 14)  Hypertrophy Prostate Bng W/urinary Obst/luts  (ICD-600.01) 15)  Elevated C-reactive Protein  (ICD-790.95) 16)  Disorder, Kidney Nos  (ICD-593.9) 17)  Nocturia  (ICD-788.43) 18)  Hyperparathyroidism, Secondary  (ICD-588.81) 19)  Allergy  (ICD-995.3) 20)  Diabetes Mellitus, Type II  (ICD-250.00)  Medications Prior to Update: 1)  Ferro-Bob 325 (65 Fe) Mg Tabs (Ferrous Sulfate) .... Take One By Mouth Daily 2)  Aspir-Low 81 Mg  Tbec (Aspirin) .Marland Kitchen.. 1daily By Mouth 3)  Verapamil Hcl Cr 240 Mg  Cp24 (Verapamil Hcl) .Marland Kitchen.. 1 Tablet  Daily By Mouth 4)  Proscar 5 Mg  Tabs (Finasteride) .... One Tab By Mouth Every Day 5)  Uroxatral 10 Mg  Tb24 (Alfuzosin Hcl) .Marland Kitchen.. 1 Daily By Mouth 6)  Shot At  Clinic 7)  Accu-Chek Aviva  Strp (Glucose Blood) .... Check Blood Sugar One To Two Times A Week 8)  Flax Seeds  Powd (Flaxseed (Linseed)) .... Take One Teaspoon Daily 9)  Vitamin B-12 500 Mcg  Tabs (Cyanocobalamin) .... Take 1 Tablet By Mouth Once A Day  Allergies: 1)  ! Penicillin G Potassium 2)  ! Maxzide-25 (Triamterene-Hctz) 3)  ! Cardura  Physical Exam  General:  Well-developed,well-nourished,in no acute distress; alert,appropriate and cooperative throughout examination, NAD. Head:  Normocephalic and atraumatic without obvious abnormalities. No apparent alopecia or balding. Eyes:  pupils equal, pupils round, and no nystagmus.  EOMs full, Conjunctiva clear bilaterally.  Ears:  External ear exam shows no significant lesions or deformities.  Otoscopic examination reveals clear canals, tympanic membranes are intact bilaterally without bulging, retraction, inflammation or discharge. Hearing is grossly normal bilaterally. L ear with hearing aid. Nose:  External nasal examination shows no deformity or inflammation. Nasal mucosa are pink and moist without lesions or exudates. Mouth:  Oral mucosa and oropharynx without lesions or exudates.  Teeth in good repair. MMM Neck:  no JVD and no carotid bruits.   Lungs:  Normal respiratory effort, chest expands symmetrically. Lungs are clear to auscultation, no crackles or wheezes. Heart:  Normal rate and regular rhythm. S1 and S2 normal without gallop, murmur, click, rub or other extra sounds. No abnml heard. Abdomen:  Bowel sounds positive and very active but benign sounding, abdomen soft and non-tender without masses, organomegaly or hernias noted.  Diabetes Management Exam:    Eye Exam:        Eye Exam done elsewhere          Date: 01/12/2010          Results: normal          Done by: Dr Marlyne Beards   Impression & Recommendations:  Problem # 1:  GASTROENTERITIS, ACUTE (ICD-558.9) Assessment Improved  Sopent quite a while reconstructing the events as the appear to have happened from looking at the chart and the entries. He had metabolic derrangement when first seen with elevated BUN/Cr as well due to his mild renal insuff, which appers to all be improving. His renal function appears to be about back to baseline.  Complete Medication List: 1)  Ferro-bob 325 (65 Fe) Mg Tabs (Ferrous sulfate) .... Take one by mouth daily 2)  Aspir-low 81 Mg Tbec (Aspirin) .Marland Kitchen.. 1daily by mouth 3)  Verapamil Hcl Cr 240 Mg Cp24 (Verapamil hcl) .Marland Kitchen.. 1 tablet  daily by mouth 4)  Proscar 5 Mg Tabs (Finasteride) .... One tab by mouth every day 5)  Uroxatral 10 Mg Tb24 (Alfuzosin hcl) .Marland Kitchen.. 1 daily by mouth 6)  Shot At Clinic  7)  Accu-chek Aviva Strp (Glucose blood) .... Check blood sugar one to two times a week 8)  Vitamin B-12 500 Mcg Tabs (Cyanocobalamin) .... Take 1 tablet by mouth once a day 9)  Lisinopril 20 Mg Tabs (Lisinopril) .... Take one by mouth daily  Patient Instructions: 1)  30 mins spent with pt with 25 in counselling.  Current Allergies (reviewed today): ! PENICILLIN G POTASSIUM ! MAXZIDE-25 (TRIAMTERENE-HCTZ) ! CARDURA

## 2010-11-01 NOTE — Progress Notes (Signed)
Summary: regarding ? drug interaction  Phone Note From Pharmacy   Caller: [employer] [facility] Summary of Call: Letter regarding drug interaction between alfuzosin and verapamil is on your desk. Initial call taken by: Lowella Petties CMA,  May 05, 2010 3:33 PM  Follow-up for Phone Call        noted, reviewed, patient tolerating meds.  no change.  Follow-up by: Crawford Givens MD,  May 05, 2010 11:27 PM

## 2010-11-01 NOTE — Assessment & Plan Note (Signed)
Summary: Dizziness Timothy Ramos   Vital Signs:  Patient profile:   77 year old male Height:      68.5 inches Weight:      162 pounds BMI:     24.36 Temp:     97.7 degrees F oral Pulse rate:   72 / minute Pulse rhythm:   regular BP sitting:   132 / 70  (left arm) Cuff size:   regular  Vitals Entered By: Delilah Shan CMA Cainan Trull Dull) (May 06, 2010 11:56 AM)  Serial Vital Signs/Assessments:  Time      Position  BP       Pulse  Resp  Temp     By           Lying LA  160/70                         Crawford Givens MD           Sitting   160/70                         Crawford Givens MD  CC: Dizziness   History of Present Illness: "Started out with the dizzy feeling", comes and goes, going on for a "long time."  Now with waking in the AM, early ( ~3AM).  "I feel like I'm sea sick."  Gradually gets better after sitting up in a chair for a period of time.  Going on for years.  Slightly worse recently.  Worse in the bed in early AM.  No room spinning, no change with rolling over in the bed.  No tinnitus.  No F, C, vomting.  Slightly nauseated with the episodes.  No presyncope.  Has been happening most days.  He did have some periods where it almost completely resolved.  Takes meds at night before bed.  No focal neuro changes.   Prev cards eval after referral with Dr. Hetty Ely.  No cardiac cause was identified for this.   Allergies: 1)  ! Penicillin G Potassium 2)  ! Maxzide-25 (Triamterene-Hctz) 3)  ! Cardura 4)  ! Zocor  Past History:  Past Medical History: Last updated: 03/15/2009 Hypertension (1992) Renal insufficiency (04/1999) Hyperlipidemia (03/1999) Diabetes mellitus, type II (05/07/2002)  Social History: Last updated: 03/15/2009 Occupation: Retired, Aeronautical engineer Married, widowed. Disabled  2 Children Remote tobacco No alcohol No illicit drugs Lives alone  Review of Systems       See HPI.  Otherwise negative.    Physical Exam  General:  GEN: nad, alert and oriented, hard of  hearing HEENT: mucous membranes moist, TMs wnl NECK: supple w/o LA CV: rrr.  PULM: ctab, no inc wob ABD: soft, +bs EXT: no edema SKIN: no acute rash  CN 2-12 wnl B, S/S/DTR wnl x4, DHP wnl   Impression & Recommendations:  Problem # 1:  NAUSEA (ICD-787.02) Unclear source.  This may be related to BP meds.  I would have patient cut verapamil in half and follow symptoms.  Call back with report.  We can discuss options after that if no relief.  I don't think this is vestibular.  >25 min spent with patient.   Complete Medication List: 1)  Ferro-bob 325 (65 Fe) Mg Tabs (Ferrous sulfate) .... Take one by mouth daily 2)  Aspir-low 81 Mg Tbec (Aspirin) .Marland Kitchen.. 1daily by mouth 3)  Verapamil Hcl Cr 240 Mg Cp24 (Verapamil hcl) .Marland Kitchen.. 1 tablet  daily by mouth 4)  Proscar 5 Mg Tabs (Finasteride) .... One tab by mouth every day 5)  Uroxatral 10 Mg Tb24 (Alfuzosin hcl) .Marland Kitchen.. 1 daily by mouth 6)  Accu-chek Aviva Strp (Glucose blood) .... Check blood sugar one to two times a week 7)  Lisinopril 20 Mg Tabs (Lisinopril) .... Take one by mouth daily 8)  Vitamin D 1000 Unit Tabs (Cholecalciferol) .... Take 1 tablet by mouth once a day  Patient Instructions: 1)  I want you to cut the verapamil 240mg  tab in half.  Don't change your other meds.  Call me back next week with an update.  We can talk about options at that point.   Current Allergies (reviewed today): ! PENICILLIN G POTASSIUM ! MAXZIDE-25 (TRIAMTERENE-HCTZ) ! CARDURA ! ZOCOR

## 2010-11-01 NOTE — Assessment & Plan Note (Signed)
Summary: 1 WEEK FOLLOWU P/[facility]   Vital Signs:  Patient profile:   77 year old male Weight:      161.75 pounds BMI:     24.32 Temp:     97.8 degrees F oral Pulse rate:   72 / minute Pulse rhythm:   regular BP sitting:   130 / 70  (left arm) Cuff size:   regular  Vitals Entered By: Sydell Axon LPN (Feb 01, 1609 10:16 AM) CC: One week follow-up   History of Present Illness: Pt here for followup of AGE with fetuid breath I thought possibly from bacterial overgrowth and treated him with anaerobic coverage antibiotic via Flagyl. We also started Jogging in a jug for GERD prophylaxis. He is feeling much better and is starting to get his appetite back. He feels much better. HE still occa has belching but otherwise things are getting back to normal. Appetite almost back to normal as well. HE asked about probiotic.  Problems Prior to Update: 1)  Gastroenteritis, Acute  (ICD-558.9) 2)  Dizziness  (ICD-780.4) 3)  Special Screening Malig Neoplasms Other Sites  (ICD-V76.49) 4)  Osteopenia  (ICD-733.90) 5)  Vitamin D Deficiency  (ICD-268.9) 6)  Hearing Loss  (ICD-389.9) 7)  Hiatal Hernia  (ICD-553.3) 8)  Gastritis, Antral  (ICD-535.40) 9)  Cad  (ICD-414.00) 10)  Dyslipidemia  (ICD-272.4) 11)  Anemia Nos  (ICD-285.9) 12)  Hypertension, Benign  (ICD-401.1) 13)  Fatigue, Acute  (ICD-780.79) 14)  Hypertrophy Prostate Bng W/urinary Obst/luts  (ICD-600.01) 15)  Elevated C-reactive Protein  (ICD-790.95) 16)  Disorder, Kidney Nos  (ICD-593.9) 17)  Nocturia  (ICD-788.43) 18)  Hyperparathyroidism, Secondary  (ICD-588.81) 19)  Allergy  (ICD-995.3) 20)  Diabetes Mellitus, Type II  (ICD-250.00)  Medications Prior to Update: 1)  Ferro-Bob 325 (65 Fe) Mg Tabs (Ferrous Sulfate) .... Take One By Mouth Daily 2)  Aspir-Low 81 Mg Tbec (Aspirin) .Marland Kitchen.. 1daily By Mouth 3)  Verapamil Hcl Cr 240 Mg  Cp24 (Verapamil Hcl) .Marland Kitchen.. 1 Tablet  Daily By Mouth 4)  Proscar 5 Mg  Tabs (Finasteride) .... One Tab By Mouth  Every Day 5)  Uroxatral 10 Mg  Tb24 (Alfuzosin Hcl) .Marland Kitchen.. 1 Daily By Mouth 6)  Shot At  Clinic 7)  Accu-Chek Aviva  Strp (Glucose Blood) .... Check Blood Sugar One To Two Times A Week 8)  Vitamin B-12 500 Mcg  Tabs (Cyanocobalamin) .... Take 1 Tablet By Mouth Once A Day 9)  Lisinopril 20 Mg Tabs (Lisinopril) .... Take One By Mouth Daily 10)  Flagyl 250 Mg Tabs (Metronidazole) .... One Tab By Mouth 4 Times A Day  Allergies: 1)  ! Penicillin G Potassium 2)  ! Maxzide-25 (Triamterene-Hctz) 3)  ! Cardura  Physical Exam  General:  Well-developed,well-nourished,in no acute distress; alert,appropriate and cooperative throughout examination, NAD. His fetid breath is resolved.. Head:  Normocephalic and atraumatic without obvious abnormalities. No apparent alopecia or balding. Sinuses NT. Eyes:  Conjunctiva clear bilaterally. Eyes back with twinkle in them! Ears:  External ear exam shows no significant lesions or deformities.  Otoscopic examination reveals clear canals, tympanic membranes are intact bilaterally without bulging, retraction, inflammation or discharge. Hearing is grossly normal bilaterally. L ear with hearing aid. Nose:  External nasal examination shows no deformity or inflammation. Nasal mucosa are pink and moist without lesions or exudates. Mouth:  Oral mucosa and oropharynx without lesions or exudates.  Teeth in good repair. MMM Neck:  no JVD and no carotid bruits.   Lungs:  Normal respiratory  effort, chest expands symmetrically. Lungs are clear to auscultation, no crackles or wheezes. Heart:  Normal rate and regular rhythm. S1 and S2 normal without gallop, murmur, click, rub or other extra sounds. No abnml heard. Abdomen:  Bowel sounds positive and very active but benign sounding, abdomen soft and non-tender without masses, organomegaly or hernias noted. No tympany noted.   Impression & Recommendations:  Problem # 1:  GASTROENTERITIS, ACUTE (ICD-558.9) Assessment  Improved Almost back to normal. Finish Ab. Cont jogging in a jug. May try Probiotic a week on, 3 weeks off. Keep scheduled appt.  Problem # 2:  HYPERTENSION, BENIGN (ICD-401.1)  His updated medication list for this problem includes:    Verapamil Hcl Cr 240 Mg Cp24 (Verapamil hcl) .Marland Kitchen... 1 tablet  daily by mouth    Lisinopril 20 Mg Tabs (Lisinopril) .Marland Kitchen... Take one by mouth daily  BP today: 130/70 Prior BP: 132/68 (01/25/2010)  Labs Reviewed: K+: 5.6 (01/10/2010) Creat: : 2.2 (01/10/2010)   Chol: 191 (09/08/2009)   HDL: 45.00 (09/08/2009)   LDL: 129 (09/08/2009)   TG: 85.0 (09/08/2009)  Problem # 3:  FATIGUE, ACUTE (ICD-780.79) Assessment: Improved Doing better and feels much better.  Complete Medication List: 1)  Ferro-bob 325 (65 Fe) Mg Tabs (Ferrous sulfate) .... Take one by mouth daily 2)  Aspir-low 81 Mg Tbec (Aspirin) .Marland Kitchen.. 1daily by mouth 3)  Verapamil Hcl Cr 240 Mg Cp24 (Verapamil hcl) .Marland Kitchen.. 1 tablet  daily by mouth 4)  Proscar 5 Mg Tabs (Finasteride) .... One tab by mouth every day 5)  Uroxatral 10 Mg Tb24 (Alfuzosin hcl) .Marland Kitchen.. 1 daily by mouth 6)  Shot At Clinic  7)  Accu-chek Aviva Strp (Glucose blood) .... Check blood sugar one to two times a week 8)  Vitamin B-12 500 Mcg Tabs (Cyanocobalamin) .... Take 1 tablet by mouth once a day 9)  Lisinopril 20 Mg Tabs (Lisinopril) .... Take one by mouth daily 10)  Flagyl 250 Mg Tabs (Metronidazole) .... One tab by mouth 4 times a day  Patient Instructions: 1)  Keep appt. 2)  Cont Jogging in a Jug.  Current Allergies (reviewed today): ! PENICILLIN G POTASSIUM ! MAXZIDE-25 (TRIAMTERENE-HCTZ) ! CARDURA

## 2010-11-01 NOTE — Miscellaneous (Signed)
Summary: Zostavax Shingles/[facility]  Zostavax Shingles/[facility]   Imported By: Maryln Gottron 05/17/2010 15:29:11  _____________________________________________________________________  External Attachment:    Type:   Image     Comment:   External Document  Appended Document: Zostavax Shingles/[facility] please add to immunization hx.   Appended Document: Zostavax Shing[facility] Done

## 2010-11-01 NOTE — Assessment & Plan Note (Signed)
Summary: DIARRHEA X SEVERAL DAYS/ 11:15   Vital Signs:  Patient profile:   77 year old male Height:      68.5 inches Weight:      161 pounds Temp:     98 degrees F oral Pulse rate:   80 / minute Pulse rhythm:   regular BP sitting:   142 / 64  (left arm) Cuff size:   regular  Vitals Entered By: Delilah Shan CMA Duncan Dull) (January 07, 2010 11:12 AM) CC: Diarrhea x several days (See HPI)   History of Present Illness: Patient was given Imodium at pharmacy and he says it stopped the diarrhea but the last pill he took made him vomit so he stopped.  He says he has not had a BM today.  [name] CMA (AAMA)  January 07, 2010 11:14 AM   Diarrhea started 5 days ago. Vomitted once.  NO blood in emesis or stool. Feeling better but wanted to make sure it was nothing serious. Eating and drinking normally now.  Current Medications (verified): 1)  Ferro-Bob 325 (65 Fe) Mg Tabs (Ferrous Sulfate) .... Take One By Mouth Daily 2)  Aspir-Low 81 Mg Tbec (Aspirin) .Marland Kitchen.. 1daily By Mouth 3)  Verapamil Hcl Cr 240 Mg  Cp24 (Verapamil Hcl) .Marland Kitchen.. 1 Tablet  Daily By Mouth 4)  Proscar 5 Mg  Tabs (Finasteride) .... One Tab By Mouth Every Day 5)  Lisinopril 20 Mg  Tabs (Lisinopril) .Marland Kitchen.. 1 Daily By Mouth 6)  Uroxatral 10 Mg  Tb24 (Alfuzosin Hcl) .Marland Kitchen.. 1 Daily By Mouth 7)  Shot At  Clinic 8)  Accu-Chek Aviva  Strp (Glucose Blood) .... Check Blood Sugar One To Two Times A Week 9)  Flax Seeds  Powd (Flaxseed (Linseed)) .... Take One Teaspoon Daily 10)  Vitamin B-12 500 Mcg  Tabs (Cyanocobalamin) .... Take 1 Tablet By Mouth Once A Day  Allergies: 1)  ! Penicillin G Potassium 2)  ! Maxzide-25 (Triamterene-Hctz) 3)  ! Cardura  Review of Systems      See HPI General:  Denies chills, fatigue, and fever. GI:  Complains of nausea and vomiting; denies abdominal pain, bloody stools, dark tarry stools, and loss of appetite.  Physical Exam  General:  Well-developed,well-nourished,in no acute distress; alert,appropriate  and cooperative throughout examination, NAD. Mouth:  MMM Abdomen:  Bowel sounds positive,abdomen soft and non-tender without masses, organomegaly or hernias noted. Psych:  Cognition and judgment appear intact. Alert and cooperative with normal attention span and concentration. No apparent delusions, illusions, hallucinations   Impression & Recommendations:  Problem # 1:  GASTROENTERITIS, ACUTE (ICD-558.9) Assessment New Resolving. Advised pushing lots of fuids, see pt instructions for details. Due to renal insufficiency, will check BMET today. Orders: Venipuncture (98119) TLB-BMP (Basic Metabolic Panel-BMET) (80048-METABOL)  Complete Medication List: 1)  Ferro-bob 325 (65 Fe) Mg Tabs (Ferrous sulfate) .... Take one by mouth daily 2)  Aspir-low 81 Mg Tbec (Aspirin) .Marland Kitchen.. 1daily by mouth 3)  Verapamil Hcl Cr 240 Mg Cp24 (Verapamil hcl) .Marland Kitchen.. 1 tablet  daily by mouth 4)  Proscar 5 Mg Tabs (Finasteride) .... One tab by mouth every day 5)  Lisinopril 20 Mg Tabs (Lisinopril) .Marland Kitchen.. 1 daily by mouth 6)  Uroxatral 10 Mg Tb24 (Alfuzosin hcl) .Marland Kitchen.. 1 daily by mouth 7)  Shot At Clinic  8)  Accu-chek Aviva Strp (Glucose blood) .... Check blood sugar one to two times a week 9)  Flax Seeds Powd (Flaxseed (linseed)) .... Take one teaspoon daily 10)  Vitamin B-12  500 Mcg Tabs (Cyanocobalamin) .... Take 1 tablet by mouth once a day  Patient Instructions: 1)  The main problem with gastroentereritis is dehydration. Drink plenty of fluids and take solids as you feel better. If you are unable to keep anything down and/or you show signs of dehydration( dry cracked lips, lack of tears, not urinating, very sleepy) , call our office.   Current Allergies (reviewed today): ! PENICILLIN G POTASSIUM ! MAXZIDE-25 (TRIAMTERENE-HCTZ) ! CARDURA

## 2010-11-01 NOTE — Medication Information (Signed)
Summary: Order for Zostavax/[facility]  Order for Zostavax/[facility]   Imported By: Lanelle Bal 04/27/2010 11:56:48  _____________________________________________________________________  External Attachment:    Type:   Image     Comment:   External Document

## 2010-11-01 NOTE — Progress Notes (Signed)
Summary: order for zostavax  Phone Note From Pharmacy   Caller: [facility] pharmacy Summary of Call: Pharmacy has faxed form for order to give pt zostavax, form is on your desk. Initial call taken by: Lowella Petties CMA,  April 21, 2010 11:09 AM  Follow-up for Phone Call        signed.  Follow-up by: Crawford Givens MD,  April 21, 2010 1:13 PM  Additional Follow-up for Phone Call Additional follow up Details #1::        Faxed. Additional Follow-up by: Delilah Shan CMA [place] Hewins Dull),  April 21, 2010 4:19 PM

## 2010-11-01 NOTE — Letter (Signed)
[facility]   Imported By: Lanelle Bal 12/17/2009 09:02:12  _____________________________________________________________________  External Attachment:    Type:   Image     Comment:   External Document

## 2010-11-01 NOTE — Miscellaneous (Signed)
Summary: Certification for PT/[facility]  Certification for PT/[facility]   Imported By: Sherian Rein 06/17/2010 12:14:45  _____________________________________________________________________  External Attachment:    Type:   Image     Comment:   External Document

## 2010-11-01 NOTE — Letter (Signed)
Summary: Nadara Eaton letter  [place] [facility]  [address] [place], [place] [address]   Phone: [phone]  Fax: [phone]       05/10/2010 MRN: [id number]  ARMEL RABBANI [address] [place], [place]  [address]  Dear Mr. [facility] [place] [place] announce the retirement of Arta Silence, M.D., from full-time practice at the [facility] office effective March 31, 2010 and his plans of returning part-time.  It is important to Dr. Hetty Ely and to our practice that you understand that [facility] has seven physicians in our office for your health care needs.  We will continue to offer the same exceptional care that you have today.    Dr. Hetty Ely has spoken to many of you about his plans for retirement and returning part-time in the fall.   We will continue to work with you through the transition to schedule appointments for you in the office and meet the high standards that [facility] is committed to.   Again, it is with great pleasure that we share the news that Dr. Hetty Ely will return to [facility] in October of 2011 with a reduced schedule.    If you have any questions, or would like to request an appointment with one of our physicians, please call us at [phone] and press the option for Scheduling an appointment.  We take pleasure in providing you with excellent patient care and look forward to seeing you at your next office visit.  Our [facility] Physicians are:  Tillman Abide, M.D. Laurita Quint, M.D. Roxy Manns, M.D. Kerby Nora, M.D. Hannah Beat, M.D. Ruthe Mannan, M.D. We proudly welcomed Raechel Ache, M.D. and Eustaquio Boyden, M.D. to the practice in July/August 2011.  Sincerely,  [facility]

## 2010-11-01 NOTE — Miscellaneous (Signed)
Summary: PT Certification/[facility]  PT Certification/[facility]   Imported By: Lanelle Bal 07/14/2010 10:28:05  _____________________________________________________________________  External Attachment:    Type:   Image     Comment:   External Document

## 2010-11-01 NOTE — Assessment & Plan Note (Signed)
Summary: ROA 6 MTHS CYD   Vital Signs:  Patient profile:   77 year old male Weight:      160.75 pounds Temp:     97.6 degrees F oral Pulse rate:   68 / minute Pulse rhythm:   regular BP sitting:   124 / 68  (left arm) Cuff size:   regular  Vitals Entered By: Sydell Axon LPN (March 17, 2010 9:55 AM) CC: 6 Month follow-up after labs   History of Present Illness: Pt here for 6 month followup. His GERD is much better. He continues with "jogging in a jug". He is going to get right eye cattaract removed Mon. He is doing well with no complaints today, feels back yto normal.    Problems Prior to Update: 1)  Gastroenteritis, Acute  (ICD-558.9) 2)  Dizziness  (ICD-780.4) 3)  Special Screening Malig Neoplasms Other Sites  (ICD-V76.49) 4)  Osteopenia  (ICD-733.90) 5)  Vitamin D Deficiency  (ICD-268.9) 6)  Hearing Loss  (ICD-389.9) 7)  Hiatal Hernia  (ICD-553.3) 8)  Gastritis, Antral  (ICD-535.40) 9)  Cad  (ICD-414.00) 10)  Dyslipidemia  (ICD-272.4) 11)  Anemia Nos  (ICD-285.9) 12)  Hypertension, Benign  (ICD-401.1) 13)  Fatigue, Acute  (ICD-780.79) 14)  Hypertrophy Prostate Bng W/urinary Obst/luts  (ICD-600.01) 15)  Elevated C-reactive Protein  (ICD-790.95) 16)  Disorder, Kidney Nos  (ICD-593.9) 17)  Nocturia  (ICD-788.43) 18)  Hyperparathyroidism, Secondary  (ICD-588.81) 19)  Allergy  (ICD-995.3) 20)  Diabetes Mellitus, Type II  (ICD-250.00)  Medications Prior to Update: 1)  Ferro-Bob 325 (65 Fe) Mg Tabs (Ferrous Sulfate) .... Take One By Mouth Daily 2)  Aspir-Low 81 Mg Tbec (Aspirin) .Marland Kitchen.. 1daily By Mouth 3)  Verapamil Hcl Cr 240 Mg  Cp24 (Verapamil Hcl) .Marland Kitchen.. 1 Tablet  Daily By Mouth 4)  Proscar 5 Mg  Tabs (Finasteride) .... One Tab By Mouth Every Day 5)  Uroxatral 10 Mg  Tb24 (Alfuzosin Hcl) .Marland Kitchen.. 1 Daily By Mouth 6)  Shot At  Clinic 7)  Accu-Chek Aviva  Strp (Glucose Blood) .... Check Blood Sugar One To Two Times A Week 8)  Vitamin B-12 500 Mcg  Tabs (Cyanocobalamin) .... Take  1 Tablet By Mouth Once A Day 9)  Lisinopril 20 Mg Tabs (Lisinopril) .... Take One By Mouth Daily 10)  Flagyl 250 Mg Tabs (Metronidazole) .... One Tab By Mouth 4 Times A Day  Allergies: 1)  ! Penicillin G Potassium 2)  ! Maxzide-25 (Triamterene-Hctz) 3)  ! Cardura  Physical Exam  General:  Well-developed,well-nourished,in no acute distress; alert,appropriate and cooperative throughout examination, NAD.Marland Kitchen Head:  Normocephalic and atraumatic without obvious abnormalities. No apparent alopecia or balding. Sinuses NT. Eyes:  Conjunctiva clear bilaterally. Eyes back with twinkle in them! Ears:  External ear exam shows no significant lesions or deformities.  Otoscopic examination reveals clear canals, tympanic membranes are intact bilaterally without bulging, retraction, inflammation or discharge. Hearing is grossly normal bilaterally. L ear with hearing aid. Nose:  External nasal examination shows no deformity or inflammation. Nasal mucosa are pink and moist without lesions or exudates. Mouth:  Oral mucosa and oropharynx without lesions or exudates.  Teeth in good repair. MMM Neck:  no JVD and no carotid bruits.   Lungs:  Normal respiratory effort, chest expands symmetrically. Lungs are clear to auscultation, no crackles or wheezes. Heart:  Normal rate and regular rhythm. S1 and S2 normal without gallop, murmur, click, rub or other extra sounds. No abnml heard. Abdomen:  Bowel sounds positive and very  active but benign sounding, abdomen soft and non-tender without masses, organomegaly or hernias noted. No tympany noted.   Impression & Recommendations:  Problem # 1:  GASTROENTERITIS, ACUTE (ICD-558.9) Assessment Improved Resolved.  Problem # 2:  DIZZINESS (ICD-780.4) Assessment: Improved Reosloved.  Problem # 3:  VITAMIN D DEFICIENCY (ICD-268.9) Assessment: Improved Start OTC Vit D replacement.  Problem # 4:  HYPERTENSION, BENIGN (ICD-401.1) Assessment: Unchanged Stable. His updated  medication list for this problem includes:    Verapamil Hcl Cr 240 Mg Cp24 (Verapamil hcl) .Marland Kitchen... 1 tablet  daily by mouth    Lisinopril 20 Mg Tabs (Lisinopril) .Marland Kitchen... Take one by mouth daily  BP today: 124/68 Prior BP: 130/70 (02/01/2010)  Labs Reviewed: K+: 4.8 (03/14/2010) Creat: : 1.9 (03/14/2010)   Chol: 191 (09/08/2009)   HDL: 45.00 (09/08/2009)   LDL: 129 (09/08/2009)   TG: 85.0 (09/08/2009)  Problem # 5:  DISORDER, KIDNEY NOS (ICD-593.9) Assessment: Improved Better but still decreased function. Discussed avoiding sweets an carbs for glucose control, good BP control and cotinuiing good fluid intake.  Complete Medication List: 1)  Ferro-bob 325 (65 Fe) Mg Tabs (Ferrous sulfate) .... Take one by mouth daily 2)  Aspir-low 81 Mg Tbec (Aspirin) .Marland Kitchen.. 1daily by mouth 3)  Verapamil Hcl Cr 240 Mg Cp24 (Verapamil hcl) .Marland Kitchen.. 1 tablet  daily by mouth 4)  Proscar 5 Mg Tabs (Finasteride) .... One tab by mouth every day 5)  Uroxatral 10 Mg Tb24 (Alfuzosin hcl) .Marland Kitchen.. 1 daily by mouth 6)  Shot At Clinic  7)  Accu-chek Aviva Strp (Glucose blood) .... Check blood sugar one to two times a week 8)  Vitamin B-12 500 Mcg Tabs (Cyanocobalamin) .... Take 1 tablet by mouth once a day 9)  Lisinopril 20 Mg Tabs (Lisinopril) .... Take one by mouth daily  Patient Instructions: 1)  RTC 12/11 for Comp Exam, labs prior  Current Allergies (reviewed today): ! PENICILLIN G POTASSIUM ! MAXZIDE-25 (TRIAMTERENE-HCTZ) ! CARDURA

## 2010-11-01 NOTE — Progress Notes (Signed)
Summary: Lab results  Phone Note Call from Patient Call back at Home Phone [phone]   Caller: Patient Call For: Dr. Dayton Martes Summary of Call: Patient is becoming agitated.  He says that he is getting tired of drawing blood every day, he wants to know what is going on with his diarrhea and vomiting.  He doesn't think all of this blood work needs to be done.   Initial call taken by: Delilah Shan CMA Duncan Dull),  January 11, 2010 9:40 AM  Follow-up for Phone Call        Please let him know that I am very sorry that he is agitated.  I was simply monitoring his kidney function and electrolytes (see lab results bc of acute on chronic renal failure.  This happens when you have chronic renal failure and you become dehydrated, you often have to hold BP meds and monitor blood work.  He has a the right to refuse more blood draws.  We are trying to figure out what is going on with his diarrhea.  If he is unhappy with me, after his ultrasound, it is ok for him to see his primary MD.   Follow-up by: Ruthe Mannan MD,  January 11, 2010 9:46 AM  Additional Follow-up for Phone Call Additional follow up Details #1::        Patient Advised. [name] CMA Duncan Dull)  January 11, 2010 9:52 AM  Noted. Shaune Leeks MD  January 11, 2010 10:22 AM

## 2010-11-01 NOTE — Letter (Signed)
Summary: [facility]   Imported By: Lanelle Bal 09/03/2010 09:34:35  _____________________________________________________________________  External Attachment:    Type:   Image     Comment:   External Document  Appended Document: [facility]     Clinical Lists Changes  Observations: Added new observation of PAST MED HX: Hypertension (1992) Renal insufficiency (04/1999)- CDK III-IV with Cr  ~2 per [facility] Hyperlipidemia (03/1999) Diabetes mellitus, type II (05/07/2002) Anemia due to renal disease per Dr. Welton Flakes with heme intermittent vertigo 2011, better with physical therapy (09/04/2010 23:55)       Past History:  Past Medical History: Hypertension (1992) Renal insufficiency (04/1999)- CDK III-IV with Cr  ~2 per [facility] Hyperlipidemia (03/1999) Diabetes mellitus, type II (05/07/2002) Anemia due to renal disease per Dr. Welton Flakes with heme intermittent vertigo 2011, better with physical therapy

## 2010-11-01 NOTE — Letter (Signed)
[facility]   Imported By: Lennie Odor 03/11/2010 15:23:31  _____________________________________________________________________  External Attachment:    Type:   Image     Comment:   External Document

## 2010-11-01 NOTE — Letter (Signed)
[facility]   Imported By: Lanelle Bal 11/09/2009 10:03:05  _____________________________________________________________________  External Attachment:    Type:   Image     Comment:   External Document

## 2010-11-01 NOTE — Letter (Signed)
Summary: Dr.Margaret [facility] Nephrology,Note  Dr.Margaret [facility] Nephrology,Note   Imported By: Beau Fanny 03/03/2010 08:23:12  _____________________________________________________________________  External Attachment:    Type:   Image     Comment:   External Document

## 2010-11-01 NOTE — Letter (Signed)
Summary: [facility]  [facility]   Imported By: Sherian Rein 06/18/2010 09:54:52  _____________________________________________________________________  External Attachment:    Type:   Image     Comment:   External Document  Appended Document: [facility]     Clinical Lists Changes  Observations: Added new observation of PAST MED HX: Hypertension (1992) Renal insufficiency (04/1999) Hyperlipidemia (03/1999) Diabetes mellitus, type II (05/07/2002) Anemia due to renal disease per Dr. Welton Flakes with heme (06/19/2010 22:21)       Past History:  Past Medical History: Hypertension (1992) Renal insufficiency (04/1999) Hyperlipidemia (03/1999) Diabetes mellitus, type II (05/07/2002) Anemia due to renal disease per Dr. Welton Flakes with heme

## 2010-11-01 NOTE — Progress Notes (Signed)
Summary: Condition no better  Phone Note Call from Patient Call back at Home Phone [phone]   Caller: Patient Call For: Dr. Para March Summary of Call: Patient was seen last week for dizziness.  He says his condition is no better.  He can be reached at the above number after 11:00 a.m. Initial call taken by: Delilah Shan CMA Timothy Ramos),  May 11, 2010 10:28 AM  Follow-up for Phone Call        I called patient and his symptoms are actually worse.  I want him to go back to a whole tab of the CCB and then report back with symptoms.  he agrees.  Follow-up by: Crawford Givens MD,  May 11, 2010 3:12 PM

## 2010-11-01 NOTE — Medication Information (Signed)
Summary: Interaction with Alfuzosin & Verapamil/[facility]  Interaction with Alfuzosin & Verapamil/[facility]   Imported By: Lanelle Bal 05/12/2010 13:51:15  _____________________________________________________________________  External Attachment:    Type:   Image     Comment:   External Document

## 2010-11-01 NOTE — Progress Notes (Signed)
Summary: refill request for proscar  Phone Note Refill Request Message from:  Fax from Pharmacy  Refills Requested: Medication #1:  PROSCAR 5 MG  TABS one tab by mouth every day   Last Refilled: 01-03-1925 Faxed request from [facility] [address], [phone].  Initial call taken by: Lowella Petties CMA,  May 03, 2010 11:12 AM  Follow-up for Phone Call        I sent it.  please notify patient.  Follow-up by: Crawford Givens MD,  May 03, 2010 1:21 PM    Prescriptions: PROSCAR 5 MG  TABS (FINASTERIDE) one tab by mouth every day  #90 x 3   Entered and Authorized by:   Crawford Givens MD   Signed by:   Crawford Givens MD on 05/03/2010   Method used:   Electronically to        [facility] (retail)       [address], [address]       [place], [place]  [address]       Ph: [phone]       Fax: [phone]   RxID:   [id number]

## 2010-11-03 NOTE — Progress Notes (Signed)
Summary: form for diabetic supplies  Phone Note From Pharmacy   Caller: [facility] Summary of Call: Form for diabetic supplies is on your desk.  Pt does want these supplies.                 Lowella Petties CMA, AAMA  September 29, 2010 4:58 PM   Follow-up for Phone Call        signed.  thanks.  please send back. Crawford Givens MD  September 29, 2010 5:01 PM   Faxed. [name] CMA Duncan Dull)  September 29, 2010 5:17 PM

## 2010-11-03 NOTE — Medication Information (Signed)
Summary: Order for Diabetes Testing Supplies  Order for Diabetes Testing Supplies   Imported By: Maryln Gottron 10/06/2010 14:31:28  _____________________________________________________________________  External Attachment:    Type:   Image     Comment:   External Document

## 2010-11-03 NOTE — Assessment & Plan Note (Signed)
Summary: CPX/CLE   Vital Signs:  Patient profile:   77 year old male Height:      68.5 inches Weight:      163.75 pounds BMI:     24.62 Temp:     97.5 degrees F oral Pulse rate:   92 / minute Pulse rhythm:   regular BP sitting:   140 / 60  (left arm) Cuff size:   regular  Vitals Entered By: Delilah Shan CMA Duncan Dull) (October 10, 2010 8:32 AM) CC: CPX   History of Present Illness: CPE was tabled for other items.    Vertigo:  1 episode of nausea nausea and regurgitation, was dizzy after this.  The episode was brief and it resolved.  Still with unclear source of AM vertigo.  This has been going on 20+ years per patient with prev work up neg.   BPH- He didn't tolerate coming off uroxatral and finsateride.  Asking about cost on uroxatral.  See instrucions.    Hypertension:      Using medication without problems or lightheadedness: as above Chest pain with exertion:no Edema:no Short of breath:no Average home BPs:not checked Other issues: as above  CKD: labs reviewed with patient.    HLD: Intolerant of mult statins.    Allergies: 1)  ! Penicillin G Potassium 2)  ! Maxzide-25 (Triamterene-Hctz) 3)  ! Cardura 4)  ! Zocor  Past History:  Past Surgical History: Last updated: 01/13/2010 TONSILLECTOMY 1949 NASAL POLYPECTOMY 1949 COLONOSCOPY  INT HEMMS  06/20/1999 EGD MILD GASTRODUODENITIS  06/20/1999 RENAL U/S  SM SEGMENT R RAS R SUPRARENAL ARTERY ANEURYSM  06/1999 DEXA OSTEOPOROSIS VIT D DEF  12/2007 ABD U/S NML 01/11/2010  Social History: Last updated: 03/15/2009 Occupation: Retired, Aeronautical engineer Married, widowed. Disabled  2 Children Remote tobacco No alcohol No illicit drugs Lives alone  Past Medical History: Hypertension (1992) Renal insufficiency (04/1999)- CDK III-IV with Cr  ~2 per [facility] Hyperlipidemia (03/1999) Diabetes mellitus, type II (05/07/2002) Anemia due to renal disease per Dr. Welton Flakes with heme Intermittent vertigo 2011, better with physical therapy BPH  followed by Urology  Review of Systems       See HPI.  Otherwise negative.    Physical Exam  General:  GEN: nad, alert and oriented, hard of hearing HEENT: mucous membranes moist, TMs wnl NECK: supple w/o LA CV: rrr.  PULM: ctab, no inc wob ABD: soft, +bs EXT: no edema SKIN: no acute rash  CN 2-12 wnl B, S/S wnl x4   Impression & Recommendations:  Problem # 1:  DIZZINESS (ICD-780.4) Unclear source.  I woudn't change meds.  He is compensating and I don't know what else can be done.  He'll let me know if he has changes.   Problem # 2:  VITAMIN D DEFICIENCY (ICD-268.9) Repleted.   Problem # 3:  DYSLIPIDEMIA (ICD-272.4) Reviwed with patient.  Not a statin candidate.  Heathy diet encouarged.   Problem # 4:  HYPERTROPHY PROSTATE BNG W/URINARY OBST/LUTS (ICD-600.01) No change in meds.  See instructions.    Problem # 5:  DISORDER, KIDNEY NOS (ICD-593.9) Cr at baseline.    Problem # 6:  SPECIAL SCREENING MALIG NEOPLASMS OTHER SITES (ICD-V76.49) Sent home with IFOB.   Complete Medication List: 1)  Ferro-bob 325 (65 Fe) Mg Tabs (Ferrous sulfate) .... Take one by mouth daily 2)  Aspir-low 81 Mg Tbec (Aspirin) .Marland Kitchen.. 1daily by mouth 3)  Verapamil Hcl Cr 240 Mg Cp24 (Verapamil hcl) .Marland Kitchen.. 1 tablet  daily by mouth 4)  Proscar  5 Mg Tabs (Finasteride) .... One tab by mouth every day 5)  Uroxatral 10 Mg Tb24 (Alfuzosin hcl) .Marland Kitchen.. 1 daily by mouth 6)  Accu-chek Aviva Strp (Glucose blood) .... Check blood sugar one to two times a week 7)  Lisinopril 20 Mg Tabs (Lisinopril) .... Take one by mouth daily 8)  Vitamin D 1000 Unit Tabs (Cholecalciferol) .... Take 1 tablet by mouth once a day  Patient Instructions: 1)  Talk to Aram Beecham on the way out about getting some help paying for the uroxatral.   2)  Don't change your meds. 3)  Let me know if the dizzy sensation changes. 4)  52month OV- 5)  Take care.     Orders Added: 1)  Est. Patient Level IV [[address]]

## 2010-11-03 NOTE — Letter (Signed)
Summary: [facility]: Immunoassay Fecal Occult Blood (iFOB) Order Form  [name] at [facility]  [address] [place], [place] [address]   Phone: [phone]  Fax: [phone]       Lab: Immunoassay Fecal Occult Blood (iFOB) Order Form   October 10, 2010 MRN: [id number]   Timothy Ramos [birth year]   Physicican Name:______[email or web address]   ___________________  Diagnosis Code:_______duncan ___________________      Crawford Givens MD

## 2010-11-03 NOTE — Letter (Signed)
Summary: Results Follow up Letter  [place] at [facility]  [address] [place], [place] [address]   Phone: [phone]  Fax: [phone]    10/18/2010 MRN: [id number]    REVIS WHALIN [address] [place], [place]  [address]    Dear Mr. Vanderwerf,  The following are the results of your recent test(s):  Test         Result    Pap Smear:        Normal _____  Not Normal _____ Comments: ______________________________________________________ Cholesterol: LDL(Bad cholesterol):         Your goal is less than:         HDL (Good cholesterol):       Your goal is more than: Comments:  ______________________________________________________ Mammogram:        Normal _____  Not Normal _____ Comments:  ___________________________________________________________________ Hemoccult:        Normal __X___  Not normal _______ Comments:  Yearly follow up is recommended.   _____________________________________________________________________ Other Tests:    We routinely do not discuss normal results over the telephone.  If you desire a copy of the results, or you have any questions about this information we can discuss them at your next office visit.   Sincerely,    Dwana Curd. Para March, M.D.  [facility]

## 2011-01-12 ENCOUNTER — Ambulatory Visit: Payer: Self-pay | Admitting: Internal Medicine

## 2011-01-16 ENCOUNTER — Encounter: Payer: Self-pay | Admitting: Family Medicine

## 2011-01-16 ENCOUNTER — Other Ambulatory Visit: Payer: Self-pay | Admitting: Family Medicine

## 2011-01-16 DIAGNOSIS — D649 Anemia, unspecified: Secondary | ICD-10-CM

## 2011-01-18 ENCOUNTER — Other Ambulatory Visit: Payer: Self-pay | Admitting: Family Medicine

## 2011-01-30 ENCOUNTER — Encounter: Payer: Self-pay | Admitting: *Deleted

## 2011-01-31 ENCOUNTER — Ambulatory Visit: Payer: Self-pay | Admitting: Internal Medicine

## 2011-02-14 NOTE — Assessment & Plan Note (Signed)
HEALTHCARE                         GASTROENTEROLOGY OFFICE NOTE   Timothy Ramos, Timothy Ramos                      MRN:          [id number]  DATE:07/26/2007                            DOB:          1923-01-24    Timothy Ramos is a 77 year old white male retiree referred for evaluation  of anemia of unexplained etiology.   Timothy Ramos has a hemoglobin of 10.5 with a slightly low platelet count  of 142,000 with normal red cell indices.  He is currently seeing Dr.  Park Ramos in Oncology.  He had a bone marrow exam, the results of which are  pending.  Review of lab data sent by Timothy Ramos shows a normal electrolyte  panel, except for serum creatinine of 2.37.  Liver function tests were  normal.  CBC showed a white count of 5.4, hemoglobin 9.7, and a platelet  count of 130,000.  Patient has been on oral iron replacement therapy for  the last year.   Timothy Ramos denies any GI complaints such as melena, hematochezia,  abdominal pain, change in bowel habits, dyspepsia, reflux symptoms, or  dysphagia.  He cooks his meals for his wife and himself, and said that  his appetite is good.  He has had some mild weight loss, but this has  seemed to have stabilized.  I previously saw Timothy Ramos in September of  2000 because of guaiac positive stools, and colonoscopy at that time  with a good prep was felt to be normal, except for some large internal  hemorrhoids.  He subsequently underwent endoscopy, which was  unremarkable, except for some slight antral gastritis, and apparently he  did have a positive CLO test at that time that was not treated.  He had  no evidence of active peptic ulcer disease.  We also sent him for small  bowel series at that time that was unremarkable.  I have not seen him in  followup since 2000.  His hemoglobin at that time was 12.5.   The patient is on 81 mg of aspirin a day, but does not take other  NSAIDS.  He otherwise, is in good health, and denies  chronic medical  problems, except for essential hypertension controlled by Timothy Ramos,  and adult onset diabetes over the last 4 to 5 years, which is managed by  dietary therapy.  He does have hyperlipidemia and BPH.   MEDICATIONS:  1. Furosemide 20 mg a day.  2. Lisinopril 20 mg a day.  3. Simvastatin 40 mg at bedtime.  4. Verapamil 240 mg a day.  5. Iron 2 pills a day.  6. Fish oil 1000 mg a day.  7. Flomax daily.  8. Aspirin 81 mg a day.   HE DENIES DRUG ALLERGIES.   FAMILY HISTORY:  Noncontributory.   SOCIAL HISTORY:  He is married and lives with his wife, and has been  married 60 years.  He has an 8th grade education.  He is retired from  [employer] work.  He does not smoke or use ethanol.   REVIEW OF SYSTEMS:  Positive for mild progressive deafness.  He denies  any cardiovascular, pulmonary, genitourinary, neurologic, orthopedic, or  endocrine problems at this time.  As mentioned above, he does have BPH,  and has an elevated creatinine, but I am not sure he has had urologic  evaluation.   EXAMINATION:  He is a very healthy, pleasant white male in no acute  distress.  Appearing younger than his stated age.  He is 6 feet tall, and weighs 157 pounds.  Blood pressure is 140/60.  Pulse was 56 and regular.  I could not appreciate stigmata of chronic  liver disease or thyromegaly.  CHEST:  Was clear.  He was in a regular rhythm without murmurs, gallops, or rubs.  I could not appreciate hepatosplenomegaly, abdominal masses, or  tenderness.  Bowel sounds were normal.  Peripheral extremities were unremarkable.  Mental status was clear.  Inspection of the rectum showed some excoriation in the coccygeal area,  but no rash consistent with herpes.  The patient relates this is from  recent trauma.  I could not appreciate any perirectal masses, fissure or  fistulae.  Rectal exam showed a slightly firm non-nodular prostate  gland.  There was soft stool in the rectal vault that was  guaiac  negative.   ASSESSMENT:  1. Chronic anemia of questionable etiology.  As mentioned above, his      bone marrow exam is pending.  The patient relates that Timothy Ramos      wanted to seem him back for followup before we scheduled      colonoscopy.  2. History of chronic guaiac positive stools with anemia of      unexplained etiology.  As mentioned previously, he has had      endoscopy, colonoscopy, and small bowel series years ago.  3. Mild chronic renal insufficiency with enlarged prostate, consider      prostate carcinoma.  4. Progressive deafness.  5. Well-controlled essential hypertension.  6. Vague history of glucose intolerance.   RECOMMENDATIONS:  Timothy Ramos is going to see Timothy Ramos next week for  followup, and we will proceed accordingly per her wishes.  If there is  no evidence of myelodysplasia, or other etiologies, we will proceed with  colonoscopy exam, and consider small bowel pill camera exam.  I would  recommend to Timothy Ramos that her perhaps get PSA level done, and  perhaps urologic  referral additionally.  I have advised him to keep taking all of his  other medicines as listed above until his workup can be completed.     Vania Rea. Jarold Motto, MD, Caleen Essex, FAGA  Electronically Signed    DRP/MedQ  DD: 07/26/2007  DT: 07/27/2007  Job #: [id number]   cc:   Arta Silence, MD  Garey Ham, M.D.

## 2011-03-02 ENCOUNTER — Telehealth: Payer: Self-pay | Admitting: *Deleted

## 2011-03-02 NOTE — Telephone Encounter (Signed)
Pt called, not happy that he didn't get a call back yesterday after he had called.  The message went to my in basket and I didn't see it.  But the message did say that it was not an emergency.  He basically wants to know what you are going to do to fix his problem- he says you told him if you couldn't help him you would send him to someone that could.  Pt didn't want to discuss this anymore today, he said he would call back in a couple of days.

## 2011-03-02 NOTE — Telephone Encounter (Signed)
I called pt and I apologized about the delay in returning the call.  He accepted the apology.  He continues to have dizzy sensations.  At last visit, we talked and the plan was to notify me if his sx worsened.  He was seen at [facility] and it was rec'd in the note to take a small sweet snack to see if that helped with the episodes.  He didn't recall this instruction, but he'll try this.  I told him that I didn't 'give up' on trying to help him, but that since I hadn't heard from him I thought that he was either improved or at least compensating.  He understood and I'll await input from him after trying the above.

## 2011-03-03 ENCOUNTER — Ambulatory Visit: Payer: Self-pay | Admitting: Internal Medicine

## 2011-03-23 ENCOUNTER — Encounter: Payer: Self-pay | Admitting: Cardiovascular Disease

## 2011-04-05 ENCOUNTER — Encounter: Payer: Self-pay | Admitting: Family Medicine

## 2011-04-10 ENCOUNTER — Ambulatory Visit: Payer: Self-pay | Admitting: Family Medicine

## 2011-04-13 ENCOUNTER — Ambulatory Visit: Payer: Self-pay | Admitting: Internal Medicine

## 2011-05-03 ENCOUNTER — Other Ambulatory Visit: Payer: Self-pay | Admitting: Family Medicine

## 2011-05-03 ENCOUNTER — Ambulatory Visit: Payer: Self-pay | Admitting: Internal Medicine

## 2011-05-03 MED ORDER — VERAPAMIL HCL ER 240 MG PO CP24: 240.0000 mg | ORAL_CAPSULE | Freq: Every day | ORAL | Status: DC

## 2011-05-10 ENCOUNTER — Telehealth: Payer: Self-pay | Admitting: *Deleted

## 2011-05-10 NOTE — Telephone Encounter (Signed)
Mark dropped off cmn/foot assessment to be fileed out for therapeutic shoes. Also needs a 3x5 rx for shoes to be medicare compliant. Call [place] When ready.  Form is on your desk.

## 2011-05-10 NOTE — Telephone Encounter (Signed)
Done, please send in.

## 2011-05-11 NOTE — Telephone Encounter (Signed)
Spoke with Loraine Leriche, forms and Rx ready for pick up will be left at front desk.

## 2011-05-29 ENCOUNTER — Other Ambulatory Visit: Payer: Self-pay | Admitting: Family Medicine

## 2011-05-29 NOTE — Telephone Encounter (Signed)
Rx phoned to pharmacy.  

## 2011-06-27 ENCOUNTER — Other Ambulatory Visit: Payer: Self-pay | Admitting: Family Medicine

## 2011-07-13 LAB — CBC
MCHC: 34.2
MCV: 93.7
Platelets: 151

## 2011-07-13 LAB — BONE MARROW EXAM

## 2011-08-10 ENCOUNTER — Ambulatory Visit: Payer: Self-pay | Admitting: Internal Medicine

## 2011-09-02 ENCOUNTER — Ambulatory Visit: Payer: Self-pay | Admitting: Internal Medicine

## 2011-10-31 ENCOUNTER — Ambulatory Visit: Payer: Self-pay | Admitting: Internal Medicine

## 2012-01-28 ENCOUNTER — Other Ambulatory Visit: Payer: Self-pay | Admitting: Family Medicine

## 2012-01-28 DIAGNOSIS — R351 Nocturia: Secondary | ICD-10-CM

## 2012-05-31 ENCOUNTER — Emergency Department: Payer: Self-pay | Admitting: Internal Medicine

## 2012-05-31 LAB — URINALYSIS, COMPLETE
Bacteria: NONE SEEN
Bilirubin,UR: NEGATIVE
Blood: NEGATIVE
Glucose,UR: NEGATIVE mg/dL (ref 0–75)
Ketone: NEGATIVE
Nitrite: NEGATIVE
RBC,UR: NONE SEEN /HPF (ref 0–5)
Specific Gravity: 1.015 (ref 1.003–1.030)
WBC UR: NONE SEEN /HPF (ref 0–5)

## 2012-05-31 LAB — BASIC METABOLIC PANEL
Anion Gap: 8 (ref 7–16)
BUN: 26 mg/dL — ABNORMAL HIGH (ref 7–18)
Chloride: 102 mmol/L (ref 98–107)
Co2: 22 mmol/L (ref 21–32)
Osmolality: 271 (ref 275–301)

## 2012-05-31 LAB — CBC
MCV: 95 fL (ref 80–100)
Platelet: 142 10*3/uL — ABNORMAL LOW (ref 150–440)
RDW: 12.6 % (ref 11.5–14.5)
WBC: 5.2 10*3/uL (ref 3.8–10.6)

## 2012-06-04 ENCOUNTER — Emergency Department: Payer: Self-pay | Admitting: Emergency Medicine

## 2012-06-07 ENCOUNTER — Inpatient Hospital Stay: Payer: Self-pay | Admitting: Family Medicine

## 2012-06-07 LAB — COMPREHENSIVE METABOLIC PANEL
Albumin: 3.9 g/dL (ref 3.4–5.0)
Anion Gap: 9 (ref 7–16)
Calcium, Total: 10.3 mg/dL — ABNORMAL HIGH (ref 8.5–10.1)
Chloride: 98 mmol/L (ref 98–107)
Creatinine: 2.29 mg/dL — ABNORMAL HIGH (ref 0.60–1.30)
EGFR (African American): 29 — ABNORMAL LOW
Potassium: 6 mmol/L — ABNORMAL HIGH (ref 3.5–5.1)
SGOT(AST): 39 U/L — ABNORMAL HIGH (ref 15–37)
SGPT (ALT): 24 U/L (ref 12–78)
Sodium: 126 mmol/L — ABNORMAL LOW (ref 136–145)
Total Protein: 7.1 g/dL (ref 6.4–8.2)

## 2012-06-07 LAB — CBC WITH DIFFERENTIAL/PLATELET
Basophil %: 1.4 %
Eosinophil #: 0.1 10*3/uL (ref 0.0–0.7)
Lymphocyte #: 0.9 10*3/uL — ABNORMAL LOW (ref 1.0–3.6)
Lymphocyte %: 18.7 %
Monocyte #: 0.3 x10 3/mm (ref 0.2–1.0)
Monocyte %: 7.4 %
Platelet: 157 10*3/uL (ref 150–440)
RDW: 12.2 % (ref 11.5–14.5)
WBC: 4.6 10*3/uL (ref 3.8–10.6)

## 2012-06-07 LAB — URINALYSIS, COMPLETE
Bacteria: NONE SEEN
Blood: NEGATIVE
Glucose,UR: NEGATIVE mg/dL (ref 0–75)
Leukocyte Esterase: NEGATIVE
Nitrite: NEGATIVE
RBC,UR: 4 /HPF (ref 0–5)

## 2012-06-07 LAB — TROPONIN I
Troponin-I: 0.09 ng/mL — ABNORMAL HIGH
Troponin-I: 0.1 ng/mL — ABNORMAL HIGH
Troponin-I: 0.11 ng/mL — ABNORMAL HIGH

## 2012-06-07 LAB — BASIC METABOLIC PANEL
Anion Gap: 7 (ref 7–16)
BUN: 26 mg/dL — ABNORMAL HIGH (ref 7–18)
Calcium, Total: 9 mg/dL (ref 8.5–10.1)
EGFR (African American): 30 — ABNORMAL LOW
EGFR (Non-African Amer.): 25 — ABNORMAL LOW
Glucose: 117 mg/dL — ABNORMAL HIGH (ref 65–99)
Osmolality: 265 (ref 275–301)

## 2012-06-07 LAB — CK TOTAL AND CKMB (NOT AT ARMC): CK, Total: 80 U/L (ref 35–232)

## 2012-06-08 LAB — BASIC METABOLIC PANEL
Anion Gap: 7 (ref 7–16)
BUN: 24 mg/dL — ABNORMAL HIGH (ref 7–18)
Co2: 22 mmol/L (ref 21–32)
Creatinine: 1.95 mg/dL — ABNORMAL HIGH (ref 0.60–1.30)
EGFR (African American): 35 — ABNORMAL LOW
EGFR (Non-African Amer.): 30 — ABNORMAL LOW
Osmolality: 270 (ref 275–301)

## 2012-06-08 LAB — CBC WITH DIFFERENTIAL/PLATELET
Basophil %: 1.4 %
Eosinophil %: 6.1 %
MCH: 34 pg (ref 26.0–34.0)
Monocyte #: 0.4 x10 3/mm (ref 0.2–1.0)
Neutrophil %: 48.1 %

## 2012-06-08 LAB — CK TOTAL AND CKMB (NOT AT ARMC): CK, Total: 75 U/L (ref 35–232)

## 2012-06-09 LAB — BASIC METABOLIC PANEL
Anion Gap: 8 (ref 7–16)
BUN: 22 mg/dL — ABNORMAL HIGH (ref 7–18)
Chloride: 107 mmol/L (ref 98–107)
Glucose: 81 mg/dL (ref 65–99)

## 2012-06-22 ENCOUNTER — Emergency Department: Payer: Self-pay | Admitting: Unknown Physician Specialty

## 2012-06-22 LAB — URINALYSIS, COMPLETE
Blood: NEGATIVE
Hyaline Cast: 5
Leukocyte Esterase: NEGATIVE
Ph: 6 (ref 4.5–8.0)
Protein: NEGATIVE
Squamous Epithelial: <1

## 2012-06-22 LAB — COMPREHENSIVE METABOLIC PANEL
Bilirubin,Total: 0.4 mg/dL (ref 0.2–1.0)
Calcium, Total: 9.9 mg/dL (ref 8.5–10.1)
Chloride: 93 mmol/L — ABNORMAL LOW (ref 98–107)
Co2: 22 mmol/L (ref 21–32)
EGFR (African American): 37 — ABNORMAL LOW
Potassium: 5 mmol/L (ref 3.5–5.1)
SGOT(AST): 18 U/L (ref 15–37)
Total Protein: 6.2 g/dL — ABNORMAL LOW (ref 6.4–8.2)

## 2012-06-22 LAB — CBC
MCH: 33.8 pg (ref 26.0–34.0)
MCHC: 35.7 g/dL (ref 32.0–36.0)
Platelet: 152 10*3/uL (ref 150–440)
RBC: 3.03 10*6/uL — ABNORMAL LOW (ref 4.40–5.90)

## 2012-06-29 ENCOUNTER — Emergency Department: Payer: Self-pay | Admitting: Emergency Medicine

## 2012-06-29 LAB — COMPREHENSIVE METABOLIC PANEL
Albumin: 4 g/dL (ref 3.4–5.0)
Alkaline Phosphatase: 102 U/L (ref 50–136)
BUN: 24 mg/dL — ABNORMAL HIGH (ref 7–18)
Calcium, Total: 10.4 mg/dL — ABNORMAL HIGH (ref 8.5–10.1)
Glucose: 126 mg/dL — ABNORMAL HIGH (ref 65–99)
Osmolality: 263 (ref 275–301)
Sodium: 128 mmol/L — ABNORMAL LOW (ref 136–145)
Total Protein: 7 g/dL (ref 6.4–8.2)

## 2012-06-29 LAB — CBC WITH DIFFERENTIAL/PLATELET
Basophil %: 0.8 %
Eosinophil #: 0 10*3/uL (ref 0.0–0.7)
HGB: 12.5 g/dL — ABNORMAL LOW (ref 13.0–18.0)
MCH: 34.1 pg — ABNORMAL HIGH (ref 26.0–34.0)
MCHC: 35.5 g/dL (ref 32.0–36.0)
Monocyte #: 0.4 x10 3/mm (ref 0.2–1.0)
Neutrophil #: 5 10*3/uL (ref 1.4–6.5)
Neutrophil %: 78.6 %
RDW: 12.6 % (ref 11.5–14.5)

## 2012-06-29 LAB — URINALYSIS, COMPLETE
Bacteria: NONE SEEN
Glucose,UR: NEGATIVE mg/dL (ref 0–75)
Ketone: NEGATIVE
Ph: 5 (ref 4.5–8.0)
RBC,UR: 34 /HPF (ref 0–5)
Squamous Epithelial: 2
WBC UR: 340 /HPF (ref 0–5)

## 2012-07-07 ENCOUNTER — Inpatient Hospital Stay: Payer: Self-pay | Admitting: Internal Medicine

## 2012-07-07 LAB — URINALYSIS, COMPLETE
Bacteria: NONE SEEN
Bilirubin,UR: NEGATIVE
Blood: NEGATIVE
Glucose,UR: NEGATIVE mg/dL (ref 0–75)
Hyaline Cast: 24
Ketone: NEGATIVE
Nitrite: NEGATIVE
Ph: 5 (ref 4.5–8.0)
Protein: 25
RBC,UR: NONE SEEN /HPF (ref 0–5)
Specific Gravity: 1.015 (ref 1.003–1.030)
Squamous Epithelial: 1
WBC UR: NONE SEEN /HPF (ref 0–5)

## 2012-07-07 LAB — COMPREHENSIVE METABOLIC PANEL
Albumin: 3.7 g/dL (ref 3.4–5.0)
Alkaline Phosphatase: 81 U/L (ref 50–136)
Anion Gap: 13 (ref 7–16)
BUN: 25 mg/dL — ABNORMAL HIGH (ref 7–18)
Bilirubin,Total: 0.6 mg/dL (ref 0.2–1.0)
Calcium, Total: 9.4 mg/dL (ref 8.5–10.1)
Chloride: 90 mmol/L — ABNORMAL LOW (ref 98–107)
Co2: 20 mmol/L — ABNORMAL LOW (ref 21–32)
Creatinine: 2.61 mg/dL — ABNORMAL HIGH (ref 0.60–1.30)
EGFR (African American): 25 — ABNORMAL LOW
EGFR (Non-African Amer.): 21 — ABNORMAL LOW
Glucose: 108 mg/dL — ABNORMAL HIGH (ref 65–99)
Osmolality: 253 (ref 275–301)
Potassium: 4.1 mmol/L (ref 3.5–5.1)
SGOT(AST): 25 U/L (ref 15–37)
SGPT (ALT): 19 U/L (ref 12–78)
Sodium: 123 mmol/L — ABNORMAL LOW (ref 136–145)
Total Protein: 6.2 g/dL — ABNORMAL LOW (ref 6.4–8.2)

## 2012-07-07 LAB — CBC
HCT: 29.6 % — ABNORMAL LOW (ref 40.0–52.0)
HGB: 10.8 g/dL — ABNORMAL LOW (ref 13.0–18.0)
MCH: 34 pg (ref 26.0–34.0)
MCHC: 36.4 g/dL — ABNORMAL HIGH (ref 32.0–36.0)
MCV: 93 fL (ref 80–100)
Platelet: 150 10*3/uL (ref 150–440)
RBC: 3.17 10*6/uL — ABNORMAL LOW (ref 4.40–5.90)
RDW: 12.3 % (ref 11.5–14.5)
WBC: 5.6 10*3/uL (ref 3.8–10.6)

## 2012-07-07 LAB — TROPONIN I: Troponin-I: 0.09 ng/mL — ABNORMAL HIGH

## 2012-07-08 LAB — BASIC METABOLIC PANEL
Anion Gap: 8 (ref 7–16)
BUN: 24 mg/dL — ABNORMAL HIGH (ref 7–18)
Creatinine: 2.23 mg/dL — ABNORMAL HIGH (ref 0.60–1.30)
EGFR (African American): 30 — ABNORMAL LOW
EGFR (Non-African Amer.): 26 — ABNORMAL LOW
Glucose: 88 mg/dL (ref 65–99)
Osmolality: 262 (ref 275–301)

## 2012-07-08 LAB — CBC WITH DIFFERENTIAL/PLATELET
Eosinophil %: 2.2 %
Lymphocyte #: 1 10*3/uL (ref 1.0–3.6)
MCH: 34.3 pg — ABNORMAL HIGH (ref 26.0–34.0)
MCV: 95 fL (ref 80–100)
Monocyte %: 10.9 %
Neutrophil #: 3.6 10*3/uL (ref 1.4–6.5)
WBC: 5.2 10*3/uL (ref 3.8–10.6)

## 2012-07-09 LAB — BASIC METABOLIC PANEL
BUN: 33 mg/dL — ABNORMAL HIGH (ref 7–18)
Chloride: 101 mmol/L (ref 98–107)
EGFR (Non-African Amer.): 23 — ABNORMAL LOW
Glucose: 92 mg/dL (ref 65–99)
Osmolality: 270 (ref 275–301)
Potassium: 5.3 mmol/L — ABNORMAL HIGH (ref 3.5–5.1)
Sodium: 131 mmol/L — ABNORMAL LOW (ref 136–145)

## 2012-07-09 LAB — CBC WITH DIFFERENTIAL/PLATELET
Basophil %: 1 %
Eosinophil #: 0.2 10*3/uL (ref 0.0–0.7)
HCT: 27.4 % — ABNORMAL LOW (ref 40.0–52.0)
HGB: 9.7 g/dL — ABNORMAL LOW (ref 13.0–18.0)
Lymphocyte #: 1.2 10*3/uL (ref 1.0–3.6)
Lymphocyte %: 21.1 %
MCH: 33.9 pg (ref 26.0–34.0)
MCHC: 35.4 g/dL (ref 32.0–36.0)
MCV: 96 fL (ref 80–100)
Neutrophil #: 3.8 10*3/uL (ref 1.4–6.5)
RDW: 12.5 % (ref 11.5–14.5)

## 2012-08-30 ENCOUNTER — Ambulatory Visit: Payer: Self-pay | Admitting: Family Medicine

## 2012-09-16 ENCOUNTER — Inpatient Hospital Stay: Payer: Self-pay | Admitting: Internal Medicine

## 2012-09-16 LAB — COMPREHENSIVE METABOLIC PANEL
Albumin: 3.5 g/dL (ref 3.4–5.0)
Alkaline Phosphatase: 103 U/L (ref 50–136)
Anion Gap: 10 (ref 7–16)
Bilirubin,Total: 0.4 mg/dL (ref 0.2–1.0)
Calcium, Total: 9.6 mg/dL (ref 8.5–10.1)
Glucose: 101 mg/dL — ABNORMAL HIGH (ref 65–99)
SGOT(AST): 70 U/L — ABNORMAL HIGH (ref 15–37)
SGPT (ALT): 23 U/L (ref 12–78)

## 2012-09-16 LAB — CBC
HGB: 9.8 g/dL — ABNORMAL LOW (ref 13.0–18.0)
MCH: 32.3 pg (ref 26.0–34.0)
Platelet: 150 10*3/uL (ref 150–440)

## 2012-09-16 LAB — URINALYSIS, COMPLETE
Bacteria: NONE SEEN
Glucose,UR: NEGATIVE mg/dL (ref 0–75)
Ketone: NEGATIVE
Nitrite: NEGATIVE
Protein: NEGATIVE
Specific Gravity: 1.005 (ref 1.003–1.030)
WBC UR: 1 /HPF (ref 0–5)

## 2012-09-17 LAB — FOLATE: Folic Acid: 8.1 ng/mL (ref 3.1–100.0)

## 2012-09-17 LAB — BASIC METABOLIC PANEL
Anion Gap: 8 (ref 7–16)
BUN: 20 mg/dL — ABNORMAL HIGH (ref 7–18)
Calcium, Total: 9.3 mg/dL (ref 8.5–10.1)
EGFR (African American): 30 — ABNORMAL LOW
EGFR (Non-African Amer.): 26 — ABNORMAL LOW
Glucose: 111 mg/dL — ABNORMAL HIGH (ref 65–99)
Osmolality: 259 (ref 275–301)

## 2012-09-17 LAB — CBC WITH DIFFERENTIAL/PLATELET
Basophil %: 1 %
Eosinophil %: 2.8 %
HGB: 9.1 g/dL — ABNORMAL LOW (ref 13.0–18.0)
Lymphocyte #: 1.2 10*3/uL (ref 1.0–3.6)
MCH: 33 pg (ref 26.0–34.0)
MCV: 93 fL (ref 80–100)
Monocyte #: 0.6 x10 3/mm (ref 0.2–1.0)
Platelet: 134 10*3/uL — ABNORMAL LOW (ref 150–440)
RBC: 2.77 10*6/uL — ABNORMAL LOW (ref 4.40–5.90)
WBC: 4.7 10*3/uL (ref 3.8–10.6)

## 2012-09-17 LAB — CK TOTAL AND CKMB (NOT AT ARMC)
CK, Total: 294 U/L — ABNORMAL HIGH (ref 35–232)
CK, Total: 223 U/L (ref 35–232)
CK, Total: 162 U/L (ref 35–232)
CK-MB: 5.6 ng/mL — ABNORMAL HIGH (ref 0.5–3.6)
CK-MB: 4.6 ng/mL — ABNORMAL HIGH (ref 0.5–3.6)
CK-MB: 3.2 ng/mL (ref 0.5–3.6)

## 2012-09-17 LAB — FERRITIN: Ferritin (ARMC): 376 ng/mL (ref 8–388)

## 2012-09-17 LAB — TROPONIN I: Troponin-I: 21 ng/mL — ABNORMAL HIGH

## 2012-09-17 LAB — APTT
Activated PTT: >160 secs (ref 23.6–35.9)
Activated PTT: 97.9 secs — ABNORMAL HIGH (ref 23.6–35.9)

## 2012-09-18 LAB — IRON AND TIBC
Iron Bind.Cap.(Total): 174 ug/dL — ABNORMAL LOW (ref 250–450)
Iron: 87 ug/dL (ref 65–175)

## 2012-09-18 LAB — BASIC METABOLIC PANEL
Anion Gap: 5 — ABNORMAL LOW (ref 7–16)
BUN: 19 mg/dL — ABNORMAL HIGH (ref 7–18)
Chloride: 103 mmol/L (ref 98–107)
Co2: 22 mmol/L (ref 21–32)
Creatinine: 1.66 mg/dL — ABNORMAL HIGH (ref 0.60–1.30)
EGFR (African American): 42 — ABNORMAL LOW
Osmolality: 262 (ref 275–301)

## 2012-09-18 LAB — CBC WITH DIFFERENTIAL/PLATELET
Basophil %: 1.1 %
Eosinophil %: 4.4 %
Lymphocyte #: 1.4 10*3/uL (ref 1.0–3.6)
MCH: 33.2 pg (ref 26.0–34.0)
MCHC: 35.3 g/dL (ref 32.0–36.0)
Monocyte %: 12.2 %
Neutrophil %: 53.9 %
Platelet: 147 10*3/uL — ABNORMAL LOW (ref 150–440)
RDW: 12.4 % (ref 11.5–14.5)

## 2012-09-18 LAB — APTT: Activated PTT: 96.8 secs — ABNORMAL HIGH (ref 23.6–35.9)

## 2012-09-19 LAB — CBC WITH DIFFERENTIAL/PLATELET
Basophil #: 0.1 10*3/uL (ref 0.0–0.1)
Basophil %: 1.2 %
Eosinophil %: 5.7 %
HCT: 26.2 % — ABNORMAL LOW (ref 40.0–52.0)
HGB: 8.7 g/dL — ABNORMAL LOW (ref 13.0–18.0)
Lymphocyte #: 1.5 10*3/uL (ref 1.0–3.6)
Lymphocyte %: 24.1 %
MCHC: 33.2 g/dL (ref 32.0–36.0)
MCV: 94 fL (ref 80–100)
Monocyte %: 9.9 %
Neutrophil #: 3.6 10*3/uL (ref 1.4–6.5)
Platelet: 151 10*3/uL (ref 150–440)
RDW: 12.4 % (ref 11.5–14.5)
WBC: 6 10*3/uL (ref 3.8–10.6)

## 2012-09-19 LAB — BASIC METABOLIC PANEL
Anion Gap: 7 (ref 7–16)
BUN: 15 mg/dL (ref 7–18)
Chloride: 106 mmol/L (ref 98–107)
Creatinine: 1.42 mg/dL — ABNORMAL HIGH (ref 0.60–1.30)
EGFR (Non-African Amer.): 44 — ABNORMAL LOW
Potassium: 4.4 mmol/L (ref 3.5–5.1)

## 2012-09-21 ENCOUNTER — Inpatient Hospital Stay: Payer: Self-pay | Admitting: Internal Medicine

## 2012-09-21 LAB — CBC
HCT: 23.9 % — ABNORMAL LOW (ref 40.0–52.0)
MCH: 32.5 pg (ref 26.0–34.0)
MCHC: 34.7 g/dL (ref 32.0–36.0)
MCV: 94 fL (ref 80–100)
Platelet: 114 10*3/uL — ABNORMAL LOW (ref 150–440)
RDW: 12.6 % (ref 11.5–14.5)

## 2012-09-21 LAB — COMPREHENSIVE METABOLIC PANEL
Alkaline Phosphatase: 92 U/L (ref 50–136)
Bilirubin,Total: 0.5 mg/dL (ref 0.2–1.0)
Calcium, Total: 9.6 mg/dL (ref 8.5–10.1)
Co2: 21 mmol/L (ref 21–32)
Creatinine: 1.68 mg/dL — ABNORMAL HIGH (ref 0.60–1.30)
EGFR (Non-African Amer.): 36 — ABNORMAL LOW
Glucose: 103 mg/dL — ABNORMAL HIGH (ref 65–99)
Potassium: 4.5 mmol/L (ref 3.5–5.1)
SGOT(AST): 34 U/L (ref 15–37)
SGPT (ALT): 25 U/L (ref 12–78)
Sodium: 125 mmol/L — ABNORMAL LOW (ref 136–145)

## 2012-09-22 LAB — CBC WITH DIFFERENTIAL/PLATELET
Basophil #: 0 10*3/uL (ref 0.0–0.1)
Basophil %: 0.4 %
Eosinophil #: 0.1 10*3/uL (ref 0.0–0.7)
Eosinophil %: 1.4 %
HCT: 22.6 % — ABNORMAL LOW (ref 40.0–52.0)
HGB: 8.3 g/dL — ABNORMAL LOW (ref 13.0–18.0)
Lymphocyte %: 7.3 %
MCHC: 36.7 g/dL — ABNORMAL HIGH (ref 32.0–36.0)
Monocyte %: 4.7 %
Neutrophil %: 86.2 %
Platelet: 101 10*3/uL — ABNORMAL LOW (ref 150–440)
WBC: 9.6 10*3/uL (ref 3.8–10.6)

## 2012-09-22 LAB — BASIC METABOLIC PANEL
Anion Gap: 6 — ABNORMAL LOW (ref 7–16)
BUN: 24 mg/dL — ABNORMAL HIGH (ref 7–18)
Calcium, Total: 9.3 mg/dL (ref 8.5–10.1)
EGFR (African American): 42 — ABNORMAL LOW
EGFR (Non-African Amer.): 37 — ABNORMAL LOW
Glucose: 104 mg/dL — ABNORMAL HIGH (ref 65–99)
Osmolality: 260 (ref 275–301)
Potassium: 5.2 mmol/L — ABNORMAL HIGH (ref 3.5–5.1)

## 2012-09-23 LAB — BASIC METABOLIC PANEL
Anion Gap: 7 (ref 7–16)
BUN: 27 mg/dL — ABNORMAL HIGH (ref 7–18)
Calcium, Total: 9.5 mg/dL (ref 8.5–10.1)
Chloride: 100 mmol/L (ref 98–107)
Creatinine: 1.61 mg/dL — ABNORMAL HIGH (ref 0.60–1.30)
EGFR (African American): 44 — ABNORMAL LOW
Glucose: 118 mg/dL — ABNORMAL HIGH (ref 65–99)
Osmolality: 269 (ref 275–301)
Potassium: 4.6 mmol/L (ref 3.5–5.1)

## 2012-09-24 LAB — CBC WITH DIFFERENTIAL/PLATELET
Basophil #: 0.1 10*3/uL (ref 0.0–0.1)
Basophil %: 1.4 %
Eosinophil %: 9 %
HCT: 22 % — ABNORMAL LOW (ref 40.0–52.0)
HGB: 7.7 g/dL — ABNORMAL LOW (ref 13.0–18.0)
Lymphocyte #: 1.1 10*3/uL (ref 1.0–3.6)
MCH: 33.4 pg (ref 26.0–34.0)
MCHC: 35.2 g/dL (ref 32.0–36.0)
MCV: 95 fL (ref 80–100)
Monocyte %: 12.2 %
Neutrophil #: 2.5 10*3/uL (ref 1.4–6.5)
Platelet: 105 10*3/uL — ABNORMAL LOW (ref 150–440)
RBC: 2.32 10*6/uL — ABNORMAL LOW (ref 4.40–5.90)
RDW: 13 % (ref 11.5–14.5)
WBC: 4.6 10*3/uL (ref 3.8–10.6)

## 2012-09-24 LAB — BASIC METABOLIC PANEL
BUN: 28 mg/dL — ABNORMAL HIGH (ref 7–18)
Calcium, Total: 9.7 mg/dL (ref 8.5–10.1)
Chloride: 101 mmol/L (ref 98–107)
Co2: 23 mmol/L (ref 21–32)
EGFR (African American): 45 — ABNORMAL LOW
Glucose: 92 mg/dL (ref 65–99)
Potassium: 4.7 mmol/L (ref 3.5–5.1)
Sodium: 132 mmol/L — ABNORMAL LOW (ref 136–145)

## 2012-09-25 LAB — CBC WITH DIFFERENTIAL/PLATELET
Basophil #: 0.1 10*3/uL (ref 0.0–0.1)
Eosinophil #: 0.4 10*3/uL (ref 0.0–0.7)
HCT: 22.7 % — ABNORMAL LOW (ref 40.0–52.0)
Lymphocyte #: 1.5 10*3/uL (ref 1.0–3.6)
Lymphocyte %: 33.1 %
MCHC: 35.1 g/dL (ref 32.0–36.0)
MCV: 95 fL (ref 80–100)
Monocyte %: 11.9 %
Neutrophil #: 2.1 10*3/uL (ref 1.4–6.5)
Neutrophil %: 45.8 %
Platelet: 121 10*3/uL — ABNORMAL LOW (ref 150–440)
RBC: 2.39 10*6/uL — ABNORMAL LOW (ref 4.40–5.90)
RDW: 13 % (ref 11.5–14.5)

## 2012-09-25 LAB — BASIC METABOLIC PANEL
BUN: 32 mg/dL — ABNORMAL HIGH (ref 7–18)
Calcium, Total: 9.6 mg/dL (ref 8.5–10.1)
Creatinine: 1.56 mg/dL — ABNORMAL HIGH (ref 0.60–1.30)
EGFR (African American): 46 — ABNORMAL LOW
EGFR (Non-African Amer.): 39 — ABNORMAL LOW
Glucose: 93 mg/dL (ref 65–99)
Potassium: 4.4 mmol/L (ref 3.5–5.1)
Sodium: 132 mmol/L — ABNORMAL LOW (ref 136–145)

## 2013-05-26 IMAGING — US US RENAL KIDNEY
1 series · 14 of 25 positions shown · non-contrast
Comparison: none

REASON FOR EXAM: arf
COMMENTS:

[Series 1: us renal kidney · 0.23mm/px · 14 of 64 slices shown]
[im 1/64]
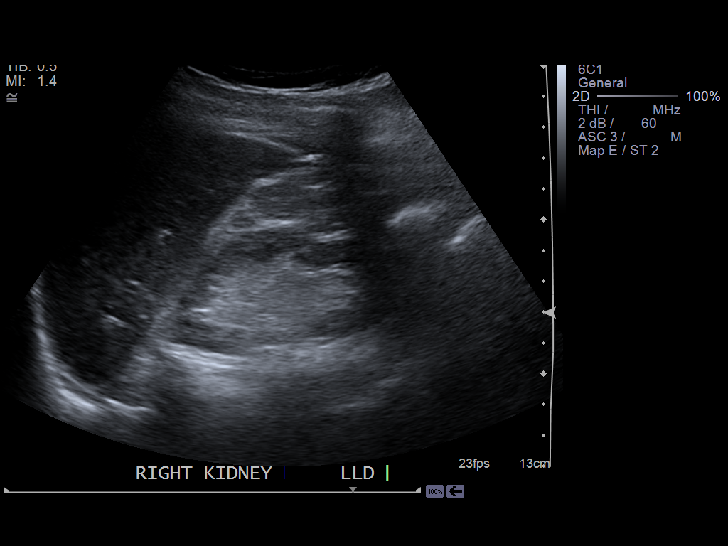
[im 6/64]
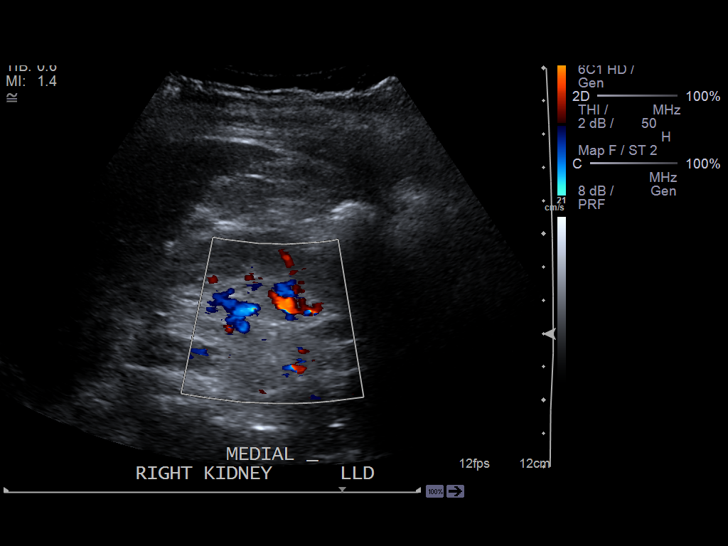
[im 11/64]
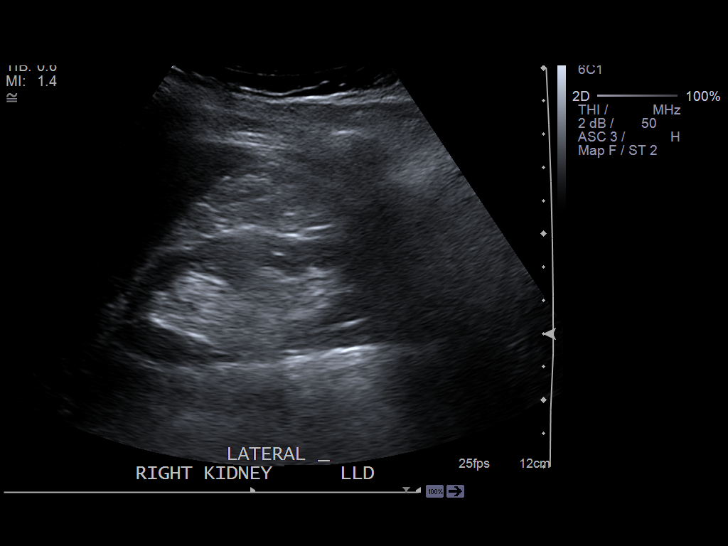
[im 16/64]
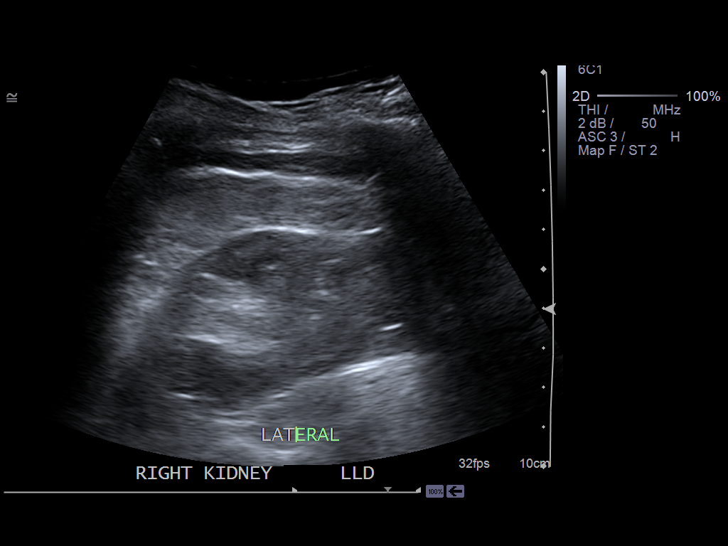
[im 22/64]
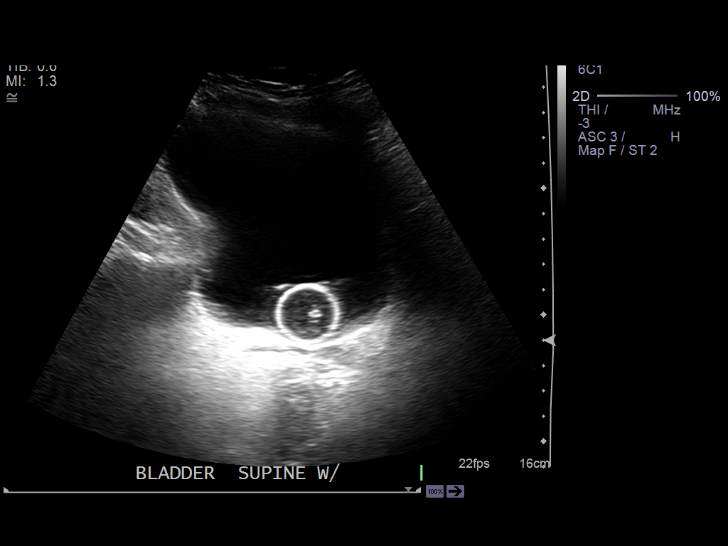
[im 24/64]
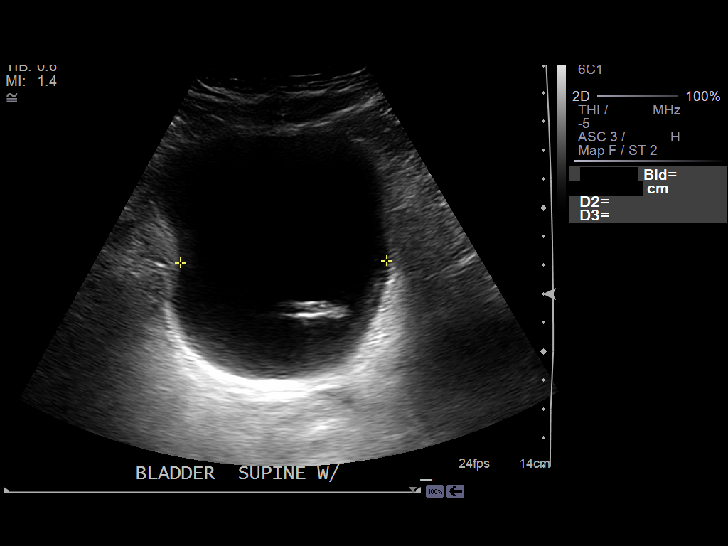
[im 29/64]
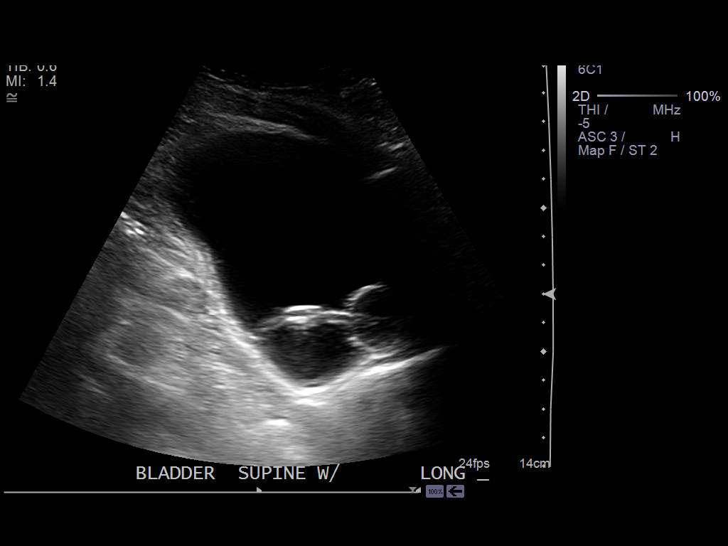
[im 35/64]
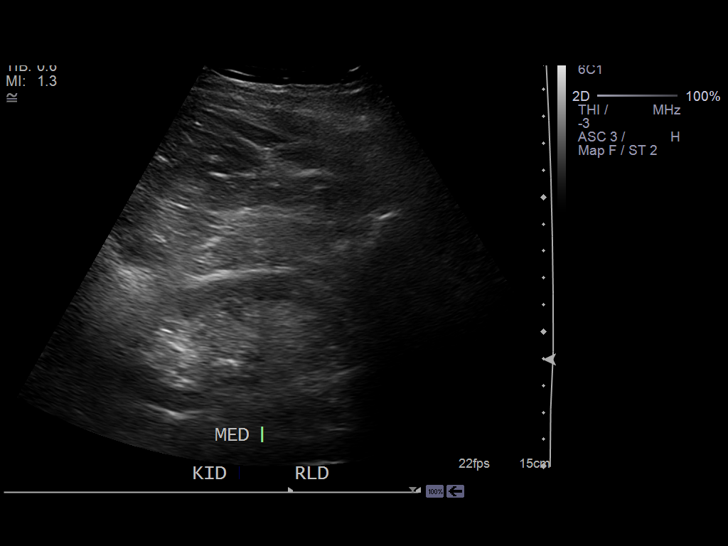
[im 40/64]
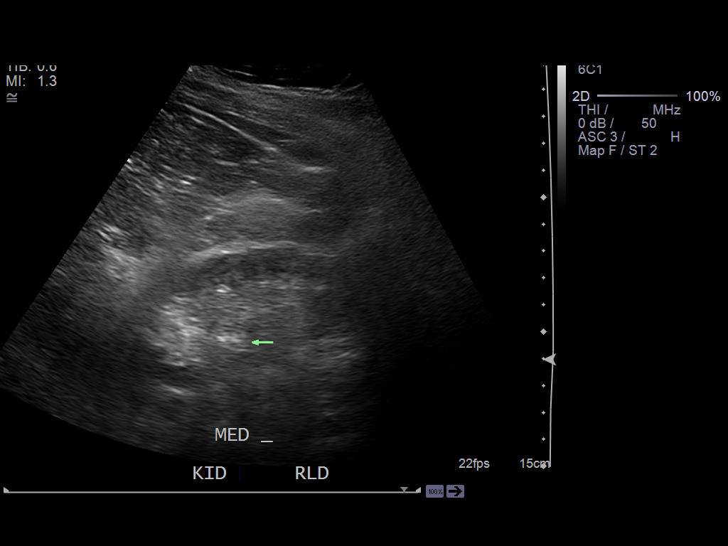
[im 43/64]
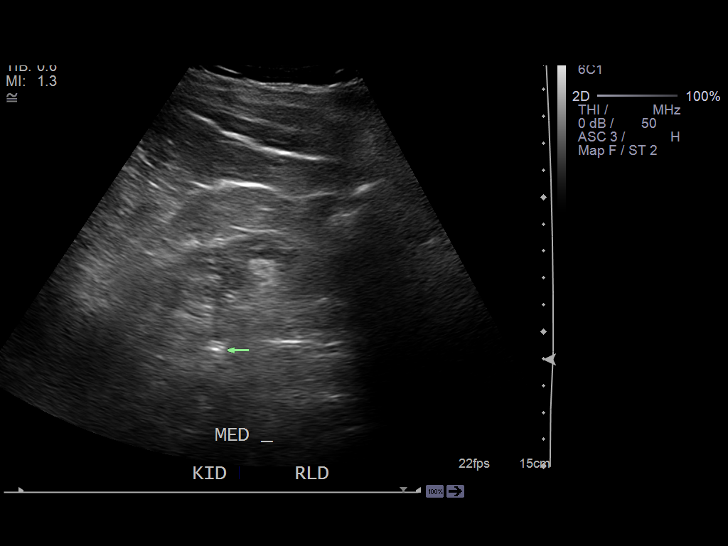
[im 48/64]
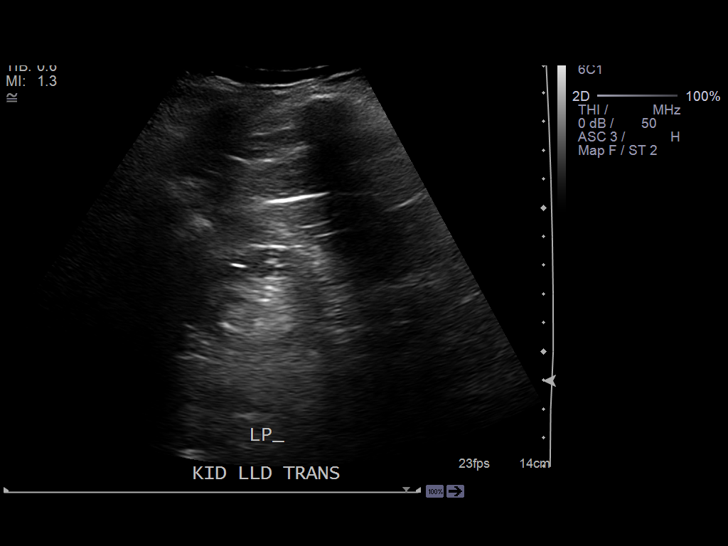
[im 53/64]
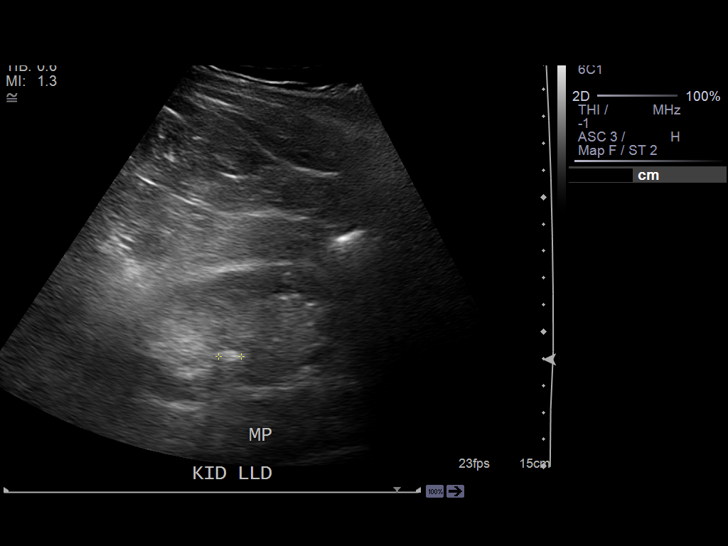
[im 58/64]
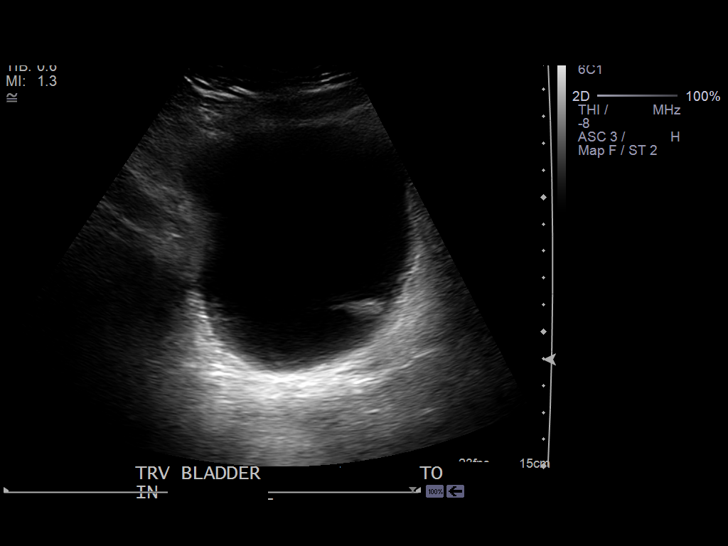
[im 64/64]
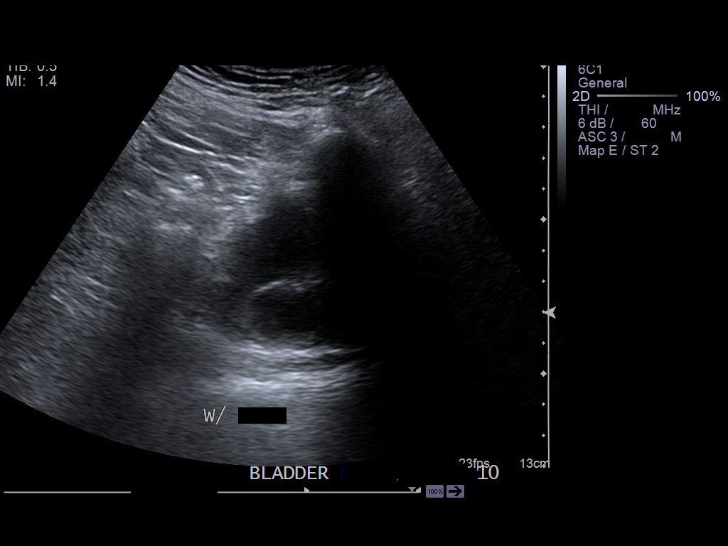

[14 of 25 positions shown; findings below may reference images not displayed]

PROCEDURE:     US  - US KIDNEY  - June 07, 2012  [DATE]

RESULT:     Renal sonogram shows the right kidney measures 8.65 x 4.83 x
4.60 cm. There is no obstruction or definite solid or cystic mass. There is
a Foley balloon in a fluid-filled bladder. The left kidney measures 9.31 x
4.97 x 5.09 cm. There is an echogenic focus measuring 0.96 cm in the mid
left kidney which could represent vascular calcification or nonocclusive
stone. Study is made more difficult by significant bowel gas. The volume of
urine in the bladder that is labeled prevoid is 322.5 cc. Volume labeled
post void is 396.4 cc. This was an attempt to void around the catheter. The
volume after draining the bladder with the catheter is 46.9 cc.
IMPRESSION: No definite obstruction. Bladder drains through the Foley
catheter. Cannot exclude left nephrolithiasis.

[REDACTED]

## 2013-08-23 IMAGING — CR DG HIP COMPLETE 2+V*L*
1 series · 2 of 2 positions shown · non-contrast
Comparison: none

REASON FOR EXAM: left hip pain
COMMENTS:   LMP: (Male)

PROCEDURE:     DXR - DXR HIP LEFT COMPLETE  - July 07, 2012  [DATE]
RESULT:     Comparison:  None

[Series 1: ap · 0.17mm/px · 2 of 2 slices shown]
[im 1/2]
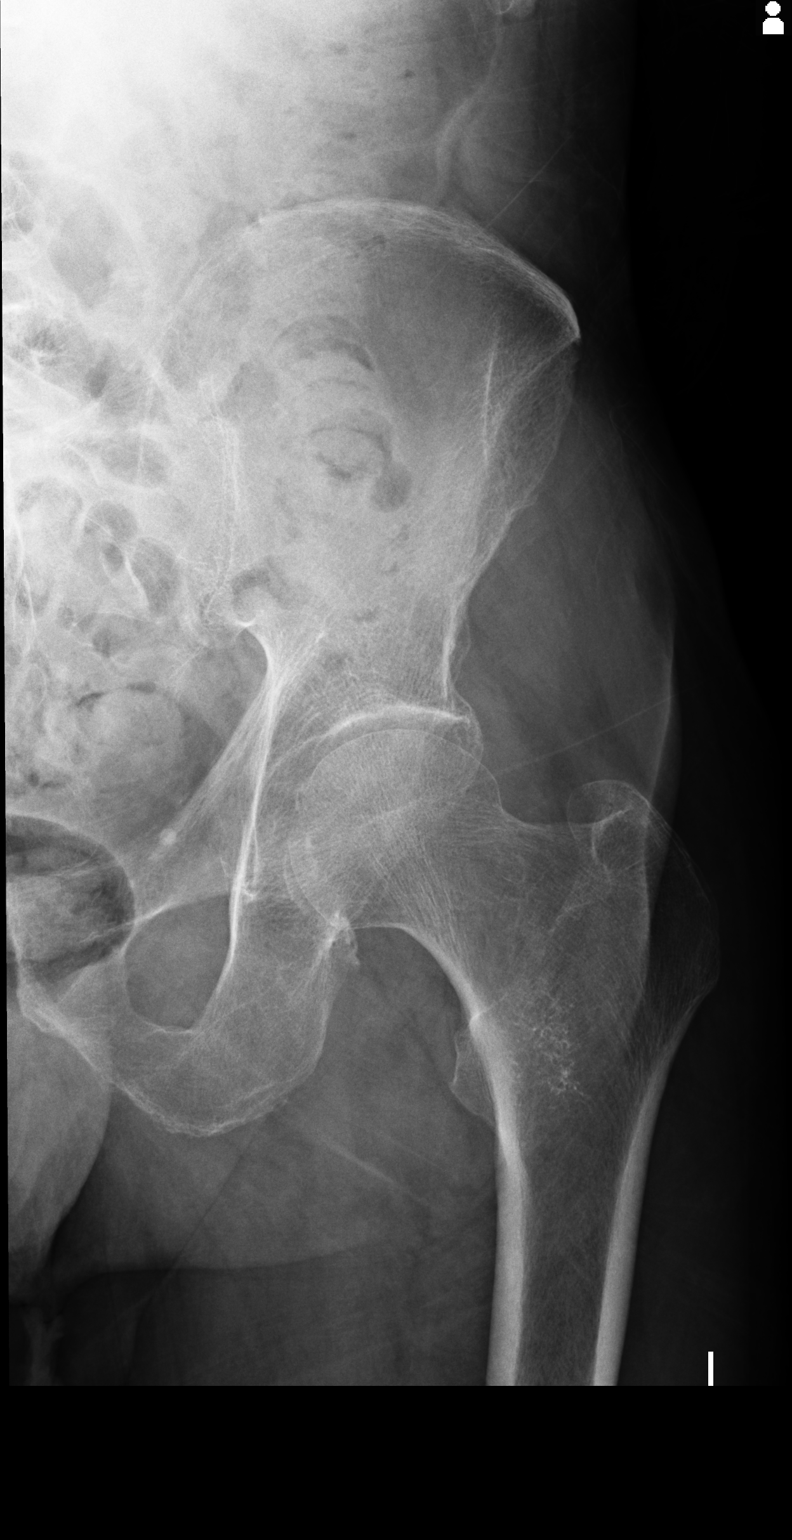
[im 2/2]
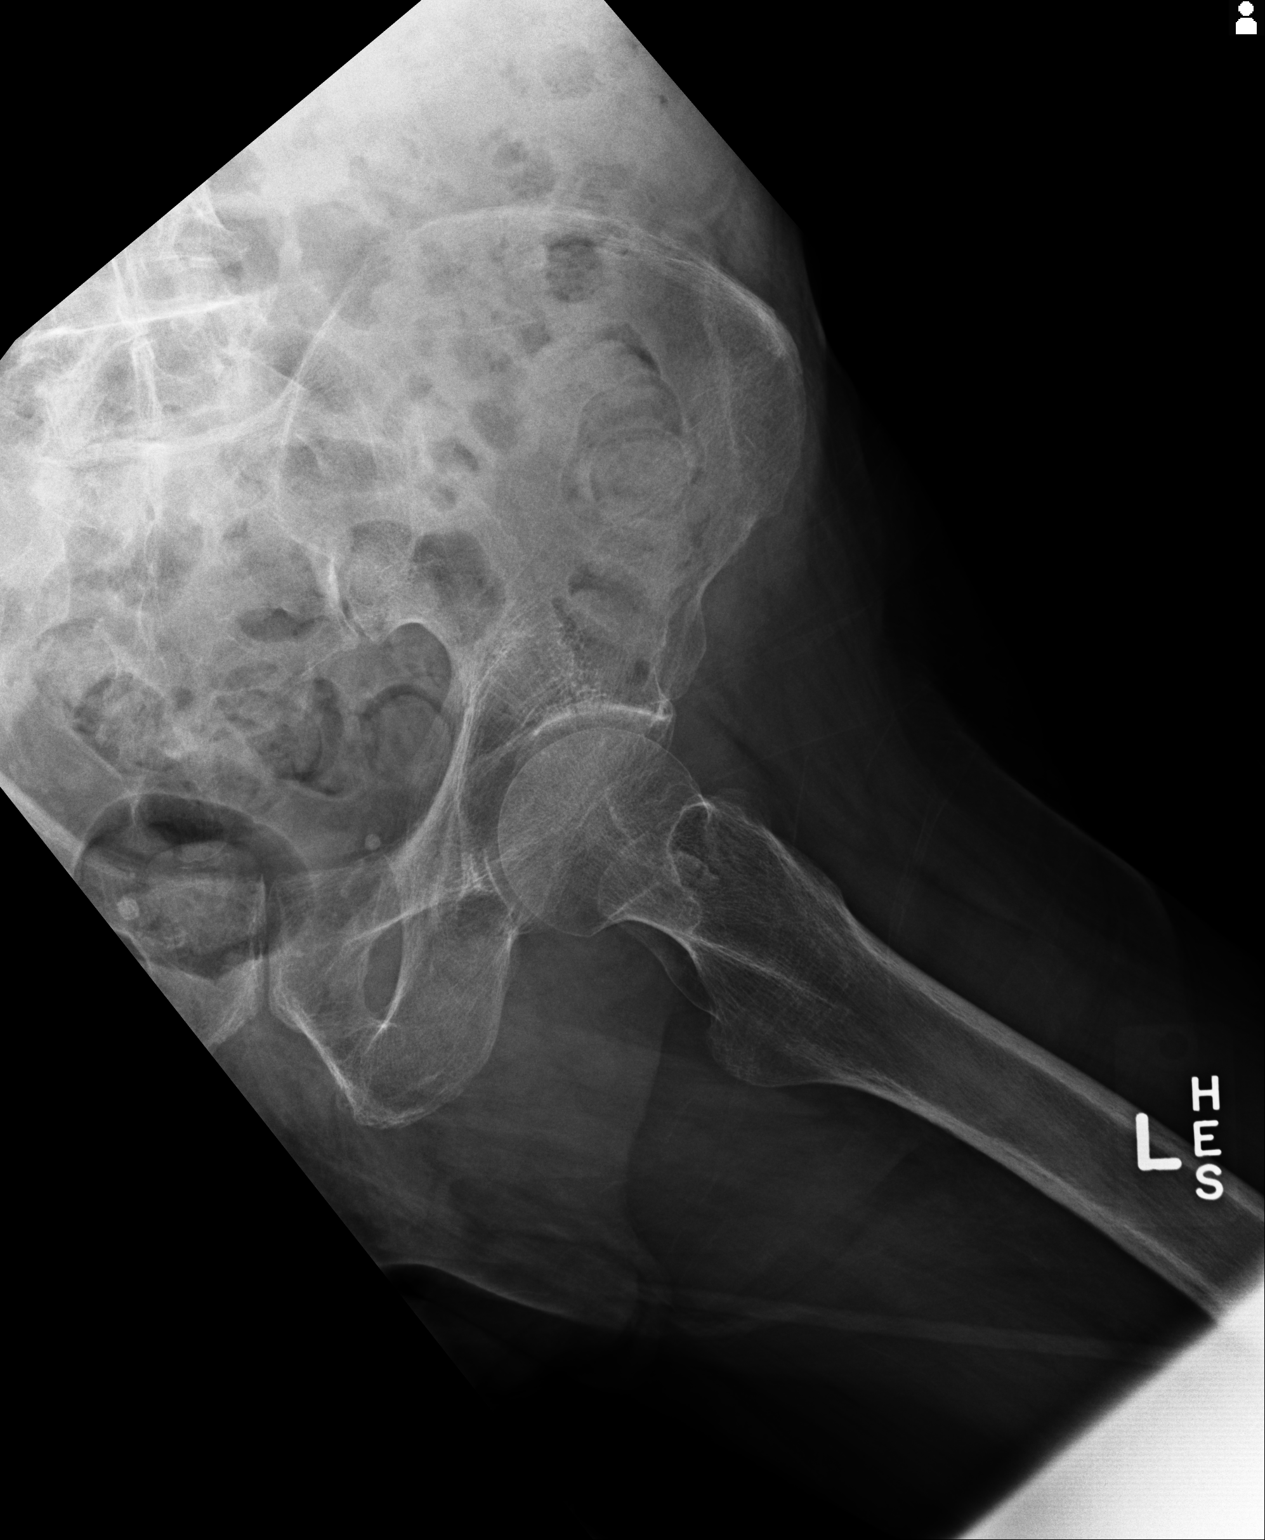

[2 of 2 positions shown; findings below may reference images not displayed]

FINDINGS: AP and frogleg lateral view of the left hip demonstrates no fracture or
dislocation. There is generalized osteopenia.
IMPRESSION: No acute osseous injury of the left hip. Given the patient's age and
osteopenia, if there is further clinical concern for a hip fracture, a
screening MRI of the hip is recommended for increased sensitivity.

[REDACTED]

## 2013-11-07 IMAGING — CR RIGHT HIP - COMPLETE 2+ VIEW
1 series · 3 of 3 positions shown · non-contrast
Comparison: none

REASON FOR EXAM: r hip pain
COMMENTS:

PROCEDURE:     DXR - DXR HIP RIGHT COMPLETE  - September 21, 2012  [DATE]
RESULT:     Comparison:  None

[Series 1: t hip ap right · 0.14mm/px · 3 of 3 slices shown]
[im 1/3]
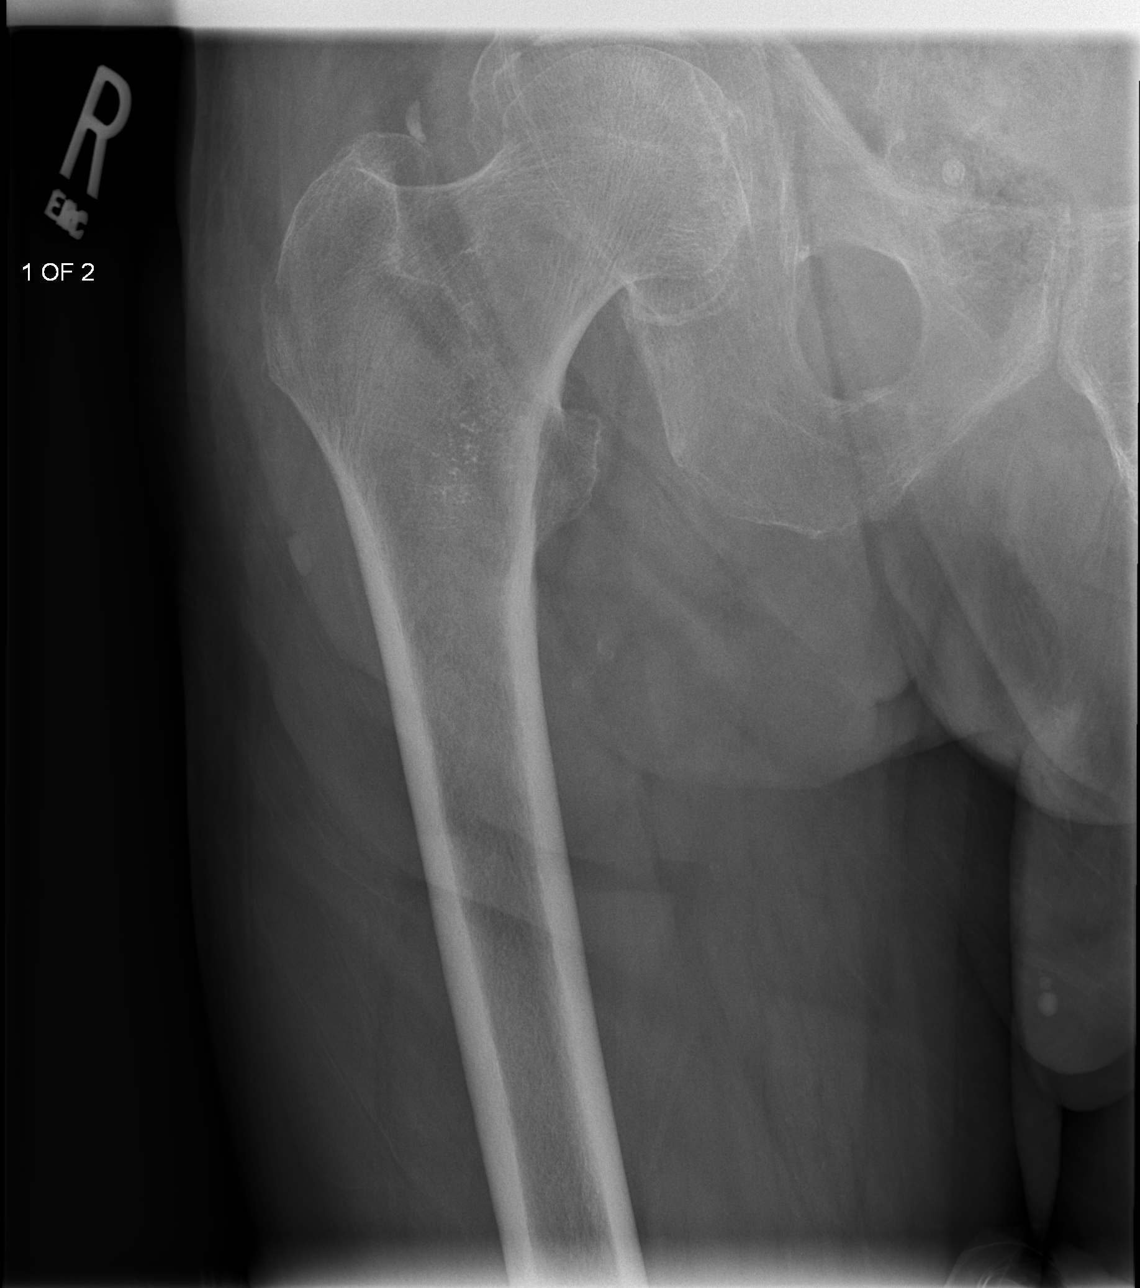
[im 2/3]
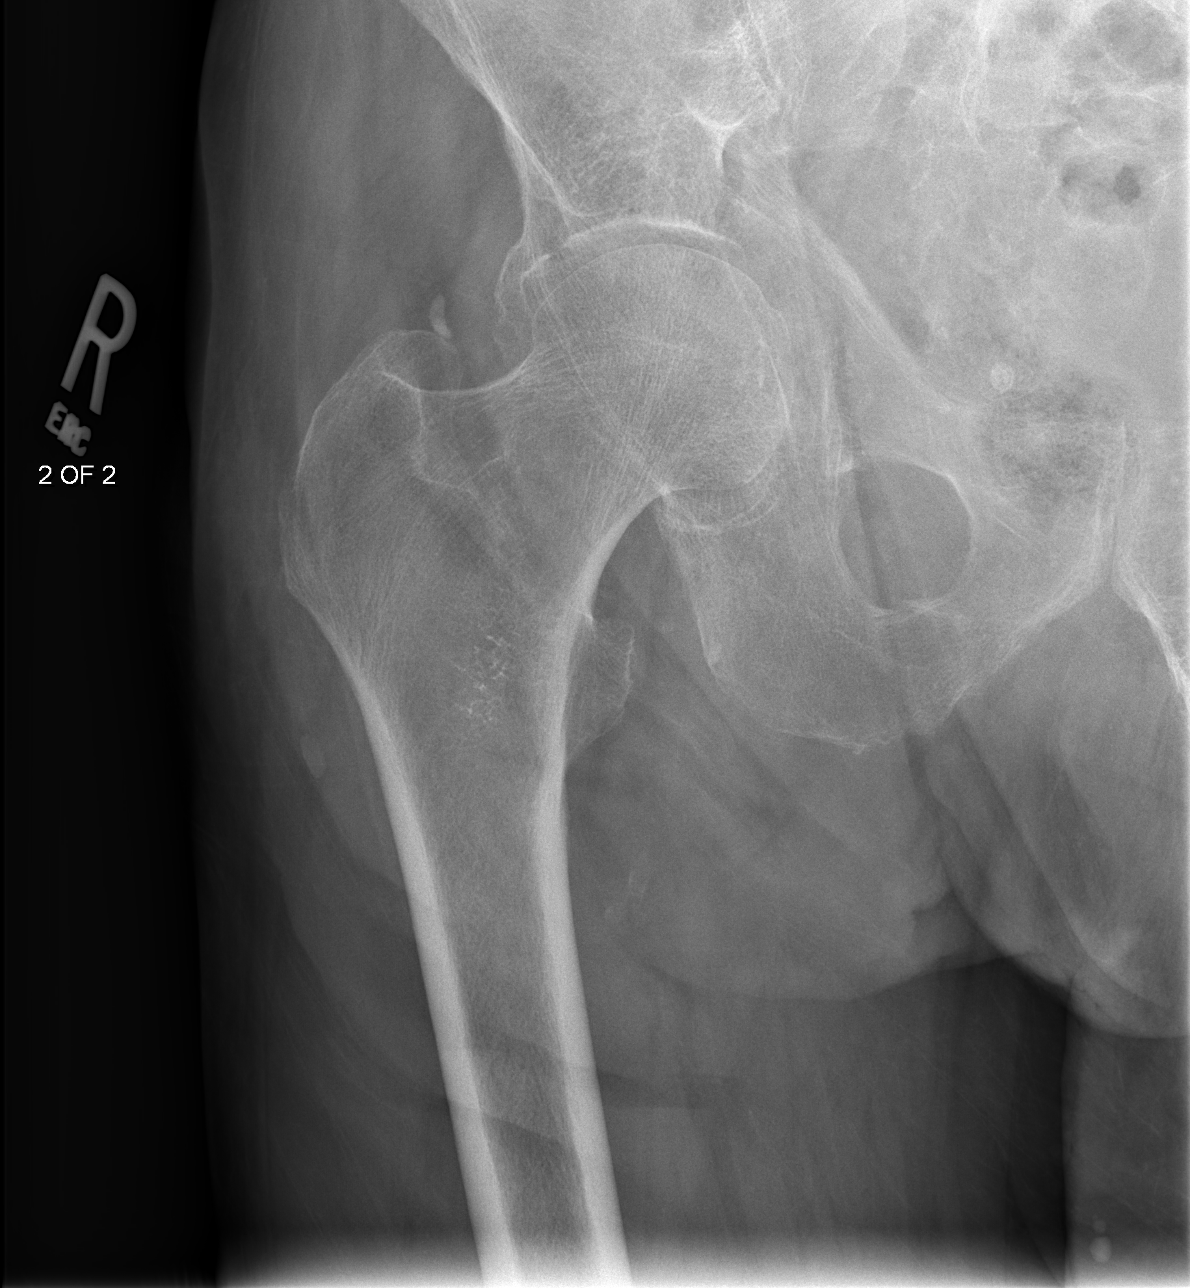
[im 3/3]
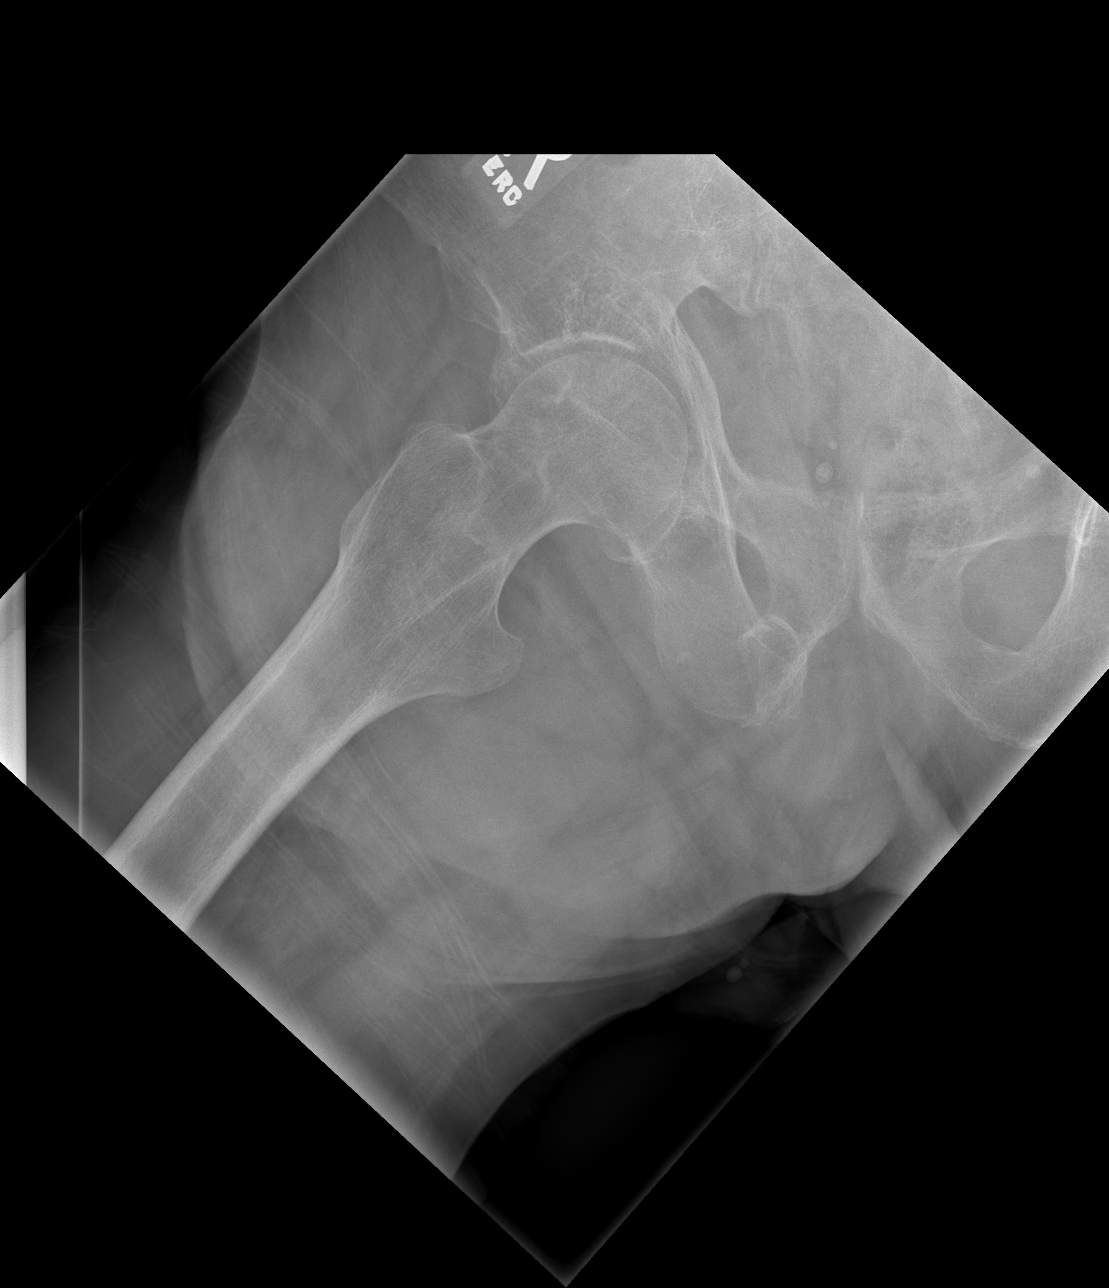

[3 of 3 positions shown; findings below may reference images not displayed]

FINDINGS: AP and frogleg lateral view of the left hip demonstrates a nondisplaced
fracture of the right inferior pubic ramus. There is a minimally displaced
fracture of the medial right acetabulum. There is no hip dislocation..
IMPRESSION: Please see above.

[REDACTED]

## 2013-11-07 IMAGING — CR PELVIS - 1-2 VIEW
1 series · 1 of 1 positions shown · non-contrast
Comparison: none

REASON FOR EXAM: pain r hip
COMMENTS:

PROCEDURE:     DXR - DXR PELVIS AP ONLY  - September 21, 2012  [DATE]
RESULT:     Comparison: None

[t pelvis ap]
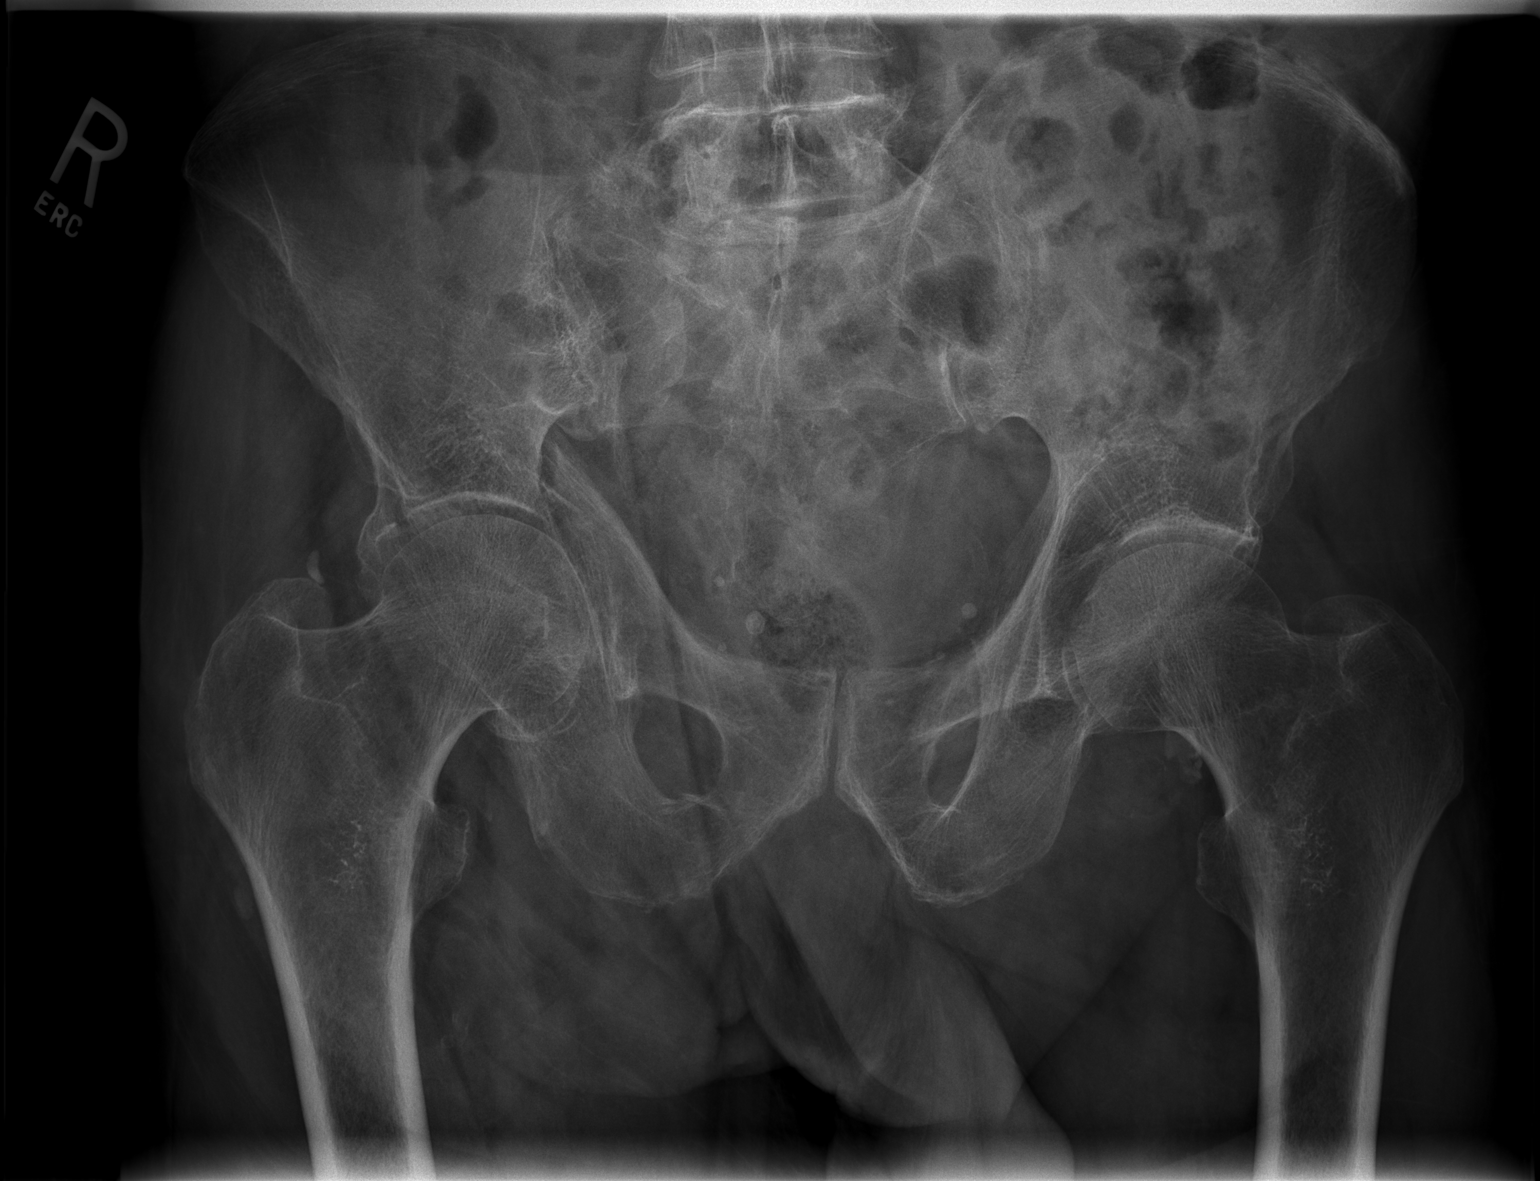

[1 of 1 positions shown; findings below may reference images not displayed]

FINDINGS: AP pelvis demonstrates a nondisplaced fracture of the right inferior pubic
ramus. There is a minimally displaced fracture of the medial right
acetabulum. There is no hip dislocation.. The joint spaces are maintained.
The sacroiliac joints are unremarkable.
IMPRESSION: Please see above.

## 2014-02-27 ENCOUNTER — Ambulatory Visit: Payer: Self-pay | Admitting: Gastroenterology

## 2014-06-18 ENCOUNTER — Ambulatory Visit: Payer: Self-pay | Admitting: Gastroenterology

## 2014-10-19 ENCOUNTER — Ambulatory Visit: Payer: Self-pay | Admitting: Neurology

## 2015-01-19 NOTE — H&P (Signed)
PATIENT NAME:  Timothy Ramos, Timothy Ramos MR#:  [id number] DATE OF BIRTH:  [birth year]  DATE OF ADMISSION:  09/21/2012  PRIMARY CARE PHYSICIAN: Yates DecampJohn Walker, III[facility]  CHIEF COMPLAINT: Fall and low sodium.   HISTORY OF PRESENT ILLNESS: The patient is an 81 year old Caucasian male with a past medical history of diet-controlled diabetes mellitus, benign prostatic hypertrophy, frequent falls, chronic renal insufficiency and anemia of chronic kidney disease, was sent over to the ER after he sustained another fall. The patient was just recently admitted to the hospital on 09/16/2012 with a similar complaint of recurrent falls and unsteadiness and hyponatremia. Today, while the patient was ambulating with a walker, he suddenly tripped over and fell on the floor. He landed on the right side, and he was having pain in his pelvic area. EMS was called, and he was brought into the ER. Pelvic x-ray and CT of the right hip was done which has revealed a comminuted fracture of the right acetabulum, nondisplaced fracture of the right inferior pubic rami. Also, the patient's sodium is noticed to be at 125.0. During the previous admission also the patient was hyponatremic with a low sodium of 117 which was increased and back to normal with a normal saline. During the previous admission, the hyponatremia was presumed from dehydration. During my examination, the patient is comfortable as he has received some pain medication. No family members were at bedside. The hospitalist team is called to admit the patient regarding hyponatremia and pain management.   PAST MEDICAL HISTORY: Hypertension, benign prostatic hypertrophy, anemia of chronic kidney disease, diet-controlled diabetes mellitus, chronic renal insufficiency, recent history of frequent falls.   PAST SURGICAL HISTORY: None.   ALLERGIES: No drug allergies.   PSYCHOSOCIAL HISTORY: The patient has been widowed for the last 5 years. He lives at home and  ambulates with the help of a walker. He denies any smoking or alcohol intake.   HOME MEDICATIONS:  Lisinopril 20 mg p.o. once daily, vitamin B12 1 tablet p.o. once daily, iron sulfate 325 mg p.o. once daily, Ecotrin 1 tablet p.o. once daily, vitamin D3 5000 units once daily.   FAMILY HISTORY: The patient's father deceased of CVA.   REVIEW OF SYSTEMS:  CONSTITUTIONAL: The patient denies any fever, fatigue, weakness, pain, weight loss or weight gain.  EYES: Denies any blurry vision, glaucoma, cataracts.  ENT: Denies tinnitus, ear discharge, snoring, postnasal drip, or difficulty swallowing.  RESPIRATORY: Denies cough, wheezing, hemoptysis, dyspnea.  CARDIOVASCULAR: Denies chest pain, orthopnea, erythema, or palpitations.  GASTROINTESTINAL: Denies nausea, vomiting, diarrhea, abdominal pain, hematemesis, melena.  GENITOURINARY: Denies dysuria, hematuria, renal calculi.  ENDOCRINE: Denies polyuria, polyphagia, polydipsia, nocturia, thyroid problems. HEMATOLOGIC/LYMPHATIC: Denies anemia, easy bruising, bleeding, swollen glands.  INTEGUMENTARY: Denies acne, rash, lesions.  MUSCULOSKELETAL: Complaining of pain in the pelvic area, especially in the right hip joint area, immobilized. There is not any gout.  NEUROLOGIC: Denies numbness, weakness, dysarthria, ataxia, migraines, tremors, vertigo. The patient is very hard of hearing.  PSYCHIATRIC: Denies any insomnia, ADD, OCD, bipolar disorder.   PHYSICAL EXAMINATION: VITAL SIGNS: Temperature 97.9, pulse 72, respiratory rate 35, blood pressure is 148/65, pulse oximetry is 96 to 97%.  GENERAL APPEARANCE: Not in acute distress, moderately built and moderately nourished.  HEENT: Normocephalic, atraumatic. Pupils are equally reacting to light and accommodation. No conjunctival injection. No nasal congestion. No ear discharge. No postnasal drip. No swelling of the pharynx.  NECK: Supple. No JVD. No thyromegaly. No carotid bruits.  LUNGS: Clear to auscultation  bilaterally. No accessory muscle usage. No anterior chest wall tenderness.  CARDIAC: S1, S2 normal. Regular rate and rhythm. Peripheral pulses are 2+.  GASTROINTESTINAL: Soft, bowel sounds are positive in all four quadrants, nontender, nondistended.  NEUROLOGICAL: Awake, alert, and oriented x3. Reflexes are 2+. Cranial nerves II through XII are grossly intact.  SKIN: No rashes. No lesions. No acne. Dry skin, warm to touch.  EXTREMITIES: Externally rotated . Peripheral pulses 2+. No cyanosis. No clubbing. No edema. PELVIS: Tender to touch, especially in the right hip joint area.   LABORATORY, DIAGNOSTIC AND RADIOLOGICAL DATA:  Glucose 103, sodium 125, BUN 25, creatinine 1.68.  During previous admission, his creatinine was fluctuating between 1.4 to 2.18, so I think 1.68 is very close to his baseline. Potassium is 4.5, chloride 96, CO2 21, GFR is 36, serum osmolality 256, total protein is 5.7, albumin 3.3, bili total is 0.5. WBC 7.8, hemoglobin 8.3, hematocrit 23.9, platelet count is 140,000.   Right hip x-ray has revealed a minimally displaced fracture of medial right acetabulum, no hip dislocation. Pelvic x-ray has revealed a minimally displaced fracture of the medial right acetabulum, no hip dislocation. Joint spaces are maintained. Sacroiliac joint are unremarkable. CT of the hip has revealed a comminuted fracture of the right acetabulum, nondisplaced fracture of the right inferior pubic ramus, nondisplaced fracture at the junction of the acetabulum and superior pubic ramus.   ASSESSMENT AND PLAN: The patient is an 81 year old male who was just recently admitted and discharged from [facility] on 09/16/2012 with recurrent falls and hyponatremia. He is presenting to the ER again with another fall and a comminuted right acetabular fracture and pelvic fracture, as well as hyponatremia. He will be admitted with the following assessment and plan.   1. Hyponatremia, probably from dehydration:   The  patient is awake, alert and oriented. Probably the hyponatremia is from dehydration. We will provide gentle hydration with IV fluids, normal saline at 75 mL/h and recheck a Chem-8 in the a.m.  2. Comminuted right acetabular fracture and pelvic fracture:  We will admit the patient for pain management. Ortho consult is placed. PT consult is placed. As the patient is falling frequently, he might be benefited with rehab placement.  3. Diet controlled diabetes mellitus: We will continue on sliding scale insulin.  4. Chronic renal insufficiency: The patient's renal function seemed to be at baseline. I will monitor closely. I will hold off on lisinopril at this time.  5. Hypertension: Blood pressure is stable at this time.  In view of renal insufficiency, we will hold off on the lisinopril; and if it is stable on that baseline, we will consider restarting the blood pressure medications in the a.m.  6. The patient will be on GI and DVT prophylaxis with close monitoring of platelet count as it is below normal.   The plan of care was discussed with the patient. The patient will be turned over to [facility] group in the a.m. Time spent on addmission is 55 min  ____________________________ Ramonita Lab, MD ag:cb D: 09/22/2012 00:29:27 ET T: 09/22/2012 13:05:24 ET JOB#: [id number]  cc: Ramonita Lab, MD, <Dictator> John B. Danne Harbor, MD Ramonita Lab MD ELECTRONICALLY SIGNED 09/25/2012 1:57

## 2015-01-19 NOTE — Consult Note (Signed)
Brief Consult Note: Diagnosis: hyponatgremia/abnormal troponin/renal insuffiency.   Patient was seen by consultant.   Recommend further assessment or treatment.   Discussed with Attending MD.   Comments: 81 yo male with history of hyonatremia admitted with hyponatremia and renal insuffiency. Has elevated serum troponin  in the 20s.  No chest pain. EKG nsstw changes. Is a difficult historian but apparently has had a noninvasive cardiac workup in the past that was unremarkable. Will continue with current meds and consider changing form verapamil to beta blocker. COntinue with heparin for now. WIll attempt to obtain records form prior workup. Does not have symtpoms of acute coronary syndrome. Has renal insuffiency with gfr of 30. Not ideal candidate for contrast coronary study at present. FUll note to follow. Will attempt medical therapy for now.  Electronic Signatures: Dalia HeadingFath, Nour Rodrigues A (MD)  (Signed 17-Dec-13 17:08)  Authored: Brief Consult Note   Last Updated: 17-Dec-13 17:08 by Dalia HeadingFath, Kirstein Baxley A (MD)

## 2015-01-19 NOTE — H&P (Signed)
PATIENT NAME:  Timothy GlassMARLOW, Griffin W MR#:  [id number] DATE OF BIRTH:  Feb 10, 1923  DATE OF ADMISSION:  06/07/2012  PRIMARY CARE PHYSICIAN: Dr. Dan HumphreysWalker.   CHIEF COMPLAINT: Nausea.   HISTORY OF PRESENT ILLNESS: The patient is an 81 year old white male with history of recent diagnosis of urinary retention and benign prostatic hypertrophy who had a Foley placed last week and has an appointment to see an urologist next week, who presents to the ED with complaint of nausea and he feels that he is not making enough urine. The patient was evaluated in the ED and also was noted to be severely constipated. He also had some blood work done and he was noted to have a creatinine that bumped up from 1.85 on 05/31/2012 to 2.29 with sodium that was low at 126. His potassium was 6. Therefore, we were asked to admit the patient. The patient reports that besides the nausea and not making much urine, he has been feeling okay. He has not had any fevers or chills. No chest pains. No shortness of breath. He denies any abdominal pain, just feels like he is full. His last bowel movement, according to him, was seven days ago. He has not had any hematemesis or hematochezia.   PAST MEDICAL HISTORY:  1. Newly diagnosed urinary retention.  2. History of benign prostatic hypertrophy.  3. History of prostatitis.  4. Hypertension.  5. Listed as having diabetes, however, the patient is not currently on any treatment according to his medications, likely diet controlled.  6. History of nose polyp removal.   ALLERGIES: None.   MEDICATIONS:  1. Ecotrin 81 mg 1 tab p.o. daily.  2. Finasteride 5 mg daily.  3. Lisinopril 20 daily.  4. MiraLAX 17 grams daily.  5. Uroxatral 10 mg extended release daily.  6. Verapamil 240 mg daily.  7. Vitamin D3 5000 international units daily.  8. Zofran 4 mg 1 tab p.o. every eight hours p.r.n. nausea or vomiting.   SOCIAL HISTORY: Does not smoke, does not drink. No drugs. Lives alone and is able to take  care of himself.   FAMILY HISTORY: Positive for hypertension.   REVIEW OF SYSTEMS: CONSTITUTIONAL: Denies any fevers, fatigue, weakness. No pain. No weight loss. No weight gain. EYES: No blurred or double vision. No pain or redness. No glaucoma. No cataracts. ENT: No tinnitus. No ear pain. No hearing loss. No seasonal or year-round allergies. No difficulty swallowing. RESPIRATORY: No cough. No wheezing. No hemoptysis. CARDIOVASCULAR: No chest pain. No orthopnea. No edema. No arrhythmia. GASTROINTESTINAL: Complains of having nausea but no vomiting or diarrhea. No abdominal pain. No hematemesis. No melena. No gastroesophageal reflux disease. No irritable bowel syndrome. No jaundice. No rectal bleeding, has constipation. GENITOURINARY: Denies any dysuria, hematuria, renal calculus or frequency. ENDOCRINE: Denies any polyuria, nocturia, or thyroid problems. HEME/LYMPH: Denies any history of anemia, easy bruisability, or bleeding. SKIN: No acne. No rash. No changes in mole, hair or skin. MUSCULOSKELETAL: Denies any pain in the neck, back, or shoulder. NEUROLOGIC: No numbness. No cerebrovascular accident. No transient ischemic attack. No seizures. PSYCHIATRIC: Denies any anxiety, insomnia, ADD or OCD.   PHYSICAL EXAMINATION:  VITAL SIGNS: Temperature 98, pulse 69, respirations 20, blood pressure 171/71, O2 97%.   GENERAL: The patient is an elderly male in no acute distress.   HEENT: Head atraumatic, normocephalic. Pupil equally round and reactive to light and accommodation. Head atraumatic. There is no conjunctival pallor. No scleral icterus. Nasal exam shows no drainage or ulceration. Oropharynx is  clear without any exudate.   NECK: No thyromegaly. No carotid bruits.   CARDIOVASCULAR: Regular rate and rhythm. No murmurs, rubs, clicks, or gallops. PMI is not displaced.   LUNGS: Clear to auscultation bilaterally without any rales, rhonchi, or wheezing.   ABDOMEN: Soft, nontender, nondistended. Positive  bowel sounds x4.   EXTREMITIES: No clubbing, cyanosis, or edema.   SKIN: No rash.   LYMPHATICS: No lymph nodes palpable.   VASCULAR: Good DP, PT pulses.   PSYCHIATRIC: Not anxious or depressed.   NEUROLOGICAL: Awake, alert, oriented x3. No focal deficits.   LABORATORY, RADIOLOGICAL AND DIAGNOSTIC DATA: Evaluations in the ED, blood glucose 102, BUN 32, creatinine 2.26, sodium 126, potassium 6.0, chloride 98, CO2 19. His anion gap is 9. Calcium is 10.3. LFTs are normal except AST 39. Troponin was 0.09. WBC 4.6, hemoglobin 11.6, platelet count 157. Urinalysis showed nitrites negative, leukocytes negative. His three-view of the abdomen showed that the patient has a large amount of retained fecal material scattered throughout his stool.   ASSESSMENT AND PLAN:  1. Nausea likely due to severe constipation. The patient had a bowel movement in the ED already. At this time will go ahead and start him on Colace, Senna. He will also receive a dose of Kayexalate for his hyperkalemia and will continue his MiraLAX. He may need lactulose if still having problem and further Fleet enemas.  2. Acute renal failure. At this time will give him IV fluids. He has a recent diagnosis of urinary retention and worsening renal function. Will check a renal ultrasound to make sure he does not have any other pathology. We will give him normal saline.  3. Hyperkalemia, likely due to renal failure. Also, is on lisinopril. I will stop the lisinopril. We will give him Kayexalate. Repeat a potassium level later tonight.  4. Hyponatremia in the setting of probably dehydration. Will give him IV fluids. Follow his sodium level in the morning.  5. Hypertension, currently blood pressure is accelerated. Will continue Cardizem, p.r.n. hydralazine. Hold lisinopril in light of acute worsening renal failure.  6. Diabetes. Not on any treatment. Blood sugars appear to be stable. Will place him on sliding scale insulin.  7. Elevated troponin.  The patient has no chest pains. We will check serial cardiac enzymes, possibly due to acute renal failure, as well as his age. If he develops any symptom, then he will need further cardiology evaluation.  8. Will place the patient on Lovenox for deep vein thrombosis prophylaxis.    TIME SPENT: 40 minutes spent on this admission.   ____________________________ Lacie Scotts Allena Katz, MD shp:ap D: 06/07/2012 13:36:58 ET T: 06/07/2012 14:45:23 ET JOB#: [id number]  cc: Brycelyn Gambino H. Allena Katz, MD, <Dictator> John B. Danne Harbor, MD  Charise Carwin MD ELECTRONICALLY SIGNED 06/07/2012 18:14

## 2015-01-19 NOTE — Consult Note (Signed)
Brief Consult Note: Diagnosis: Right acetabular fracture.   Patient was seen by consultant.   Recommend further assessment or treatment.   Comments: 81 year old male fell yesterday and [facility] xrays showed a comminuted fracture of the right pelvis involving the rami and acetabulum.  Patient currently denies pain in the right hip while lying in bed.  He denies other injuries.  I have reviewed the medical history from the hospital EMR.  ON exam, his right hip/pelvis: has intact skin.  There is no swelling, erythema or ecchymosis.  He has tenderness with log rolling of the right hip and with palpation over the right hip joint.  No crepitus is felt.  Patient has intact motor and sensory function in the right lower extremity.  He has palpable pedal pulses.  I have reviewed the xrays and CT scan and the findings are as noted above.  I recommend he is NWB on the right side.  He should have a PT evaluation and must use a walker for ambulation.  Patient lives alone and will need SNF care upon discharge given his NWB status on the R LE.  Electronic Signatures: Juanell FairlyKrasinski, Maruice Pieroni (MD)  (Signed 22-Dec-13 12:32)  Authored: Brief Consult Note   Last Updated: 22-Dec-13 12:32 by Juanell FairlyKrasinski, Cristabel Bicknell (MD)

## 2015-01-19 NOTE — Discharge Summary (Signed)
PATIENT NAME:  Timothy Ramos, Timothy Ramos MR#:  [id number] DATE OF BIRTH:  10-16-1922  DATE OF ADMISSION:  09/16/2012 DATE OF DISCHARGE:  09/19/2012  HISTORY OF PRESENT ILLNESS: Mr. Kopka an 81 year old gentleman who had been to the office on the day of admission accompanied by his daughter complaining of nausea, vomiting and weakness. Although the patient's examination was relatively unremarkable, he was found to have a serum sodium of 117. The patient had already gone home at that time but was called and asked to go to the Emergency Room. In the Emergency Room, the patient was found to continue to be severely hyponatremic. He was also found to have an elevated troponin. The patient was therefore admitted.   The patient's past medical history was notable for hypertension, chronic renal insufficiency, anemia of chronic disease and diet-controlled diabetes. The patient was known to have BPH with chronic urinary retention. He had been followed by Dr. Lonna Cobb. He had also seen Dr. Patsi Sears in [place]. He had an indwelling Foley catheter.   The patient's medications on admission included lisinopril 20 mg daily, finasteride 5 mg daily, Ecotrin 81 mg daily, vitamin D 5000 units once a day and verapamil extended release 240 mg daily.   The patient had no known drug allergies.  The patient's admission physical examination revealed a temperature of 98.3, a pulse of 69, respirations of 24 and a blood pressure of 178/72. Pulse oximetry was 99% on room air. Admission physical examination, as described by the admitting physician, was most notable for multiple abrasions and contusions from where he had falls over the weekend.   The patient's admission CBC showed a hemoglobin of 9.8 with a hematocrit of 28.5. White count was 5600. Platelet count was 150,000. Admission comprehensive metabolic panel showed a blood sugar of 101, a BUN of 21, a creatinine of 2.6, a sodium of 119, a chloride of 90, a CO2 of 19, a SGOT of 70, a  total protein of 6.2 and an estimated GFR of 21. Troponin was 27. Ferritin was 376. Folic acid was 8.1. CPK was 294 with a CPK-MB of 5.6.   HOSPITAL COURSE: The patient was admitted to the regular medical floor where he was rehydrated with IV fluids due to volume depletion and hyponatremia. He was seen in consultation because of his elevated troponin. His EKG was normal. Echocardiogram also eventually was basically unremarkable showing an ejection fraction of 55%. There was mild mitral regurgitation. The left atrium was mildly dilated. There was anterior wall akinesis. The patient denied any cardiac symptoms. It was my feeling that the troponin was noncardiac in origin. This was confirmed by cardiology. By the time of discharge, the patient's basic metabolic panel showed a BUN of 15 with a creatinine of 1.42. Sodium was 134. Estimated GFR was 44. The remainder of the panel was normal. With rehydration the patient's hemoglobin dropped to 8.7 with a hematocrit of 26.2.   DISCHARGE DIAGNOSES: 1. Acute renal failure with hyponatremia secondary to nausea and vomiting.  2. Hypertension.  3. Chronic benign prostatic hypertrophy with bladder outlet obstruction.  4. Elevated troponin most likely due to rhabdomyolysis.   DISCHARGE DISPOSITION: The patient was discharged on a regular diet with activity as tolerated. He will be set up for home health. He is to be followed up in the office in 1 to 2 weeks.  ____________________________ Letta Pate Danne Harbor, MD jbw:sb D: 09/24/2012 08:06:00 ET T: 09/24/2012 10:52:15 ET JOB#: [id number]  cc: Jonny Ruiz B. Danne Harbor, MD, <Dictator> [facility]  Clarene CritchleyB WALKER III MD ELECTRONICALLY SIGNED 09/24/2012 11:27

## 2015-01-19 NOTE — Consult Note (Signed)
PATIENT NAME:  Timothy Ramos, Timothy Ramos MR#:  [id number] DATE OF BIRTH:  11/09/1922  DATE OF CONSULTATION:  07/09/2012  REFERRING PHYSICIAN:  Dr. Dan HumphreysWalker  CONSULTING PHYSICIAN:  Micheal Sheen C. Lonna CobbStoioff, MD  REASON FOR CONSULTATION: Urinary retention/acute renal failure.   HISTORY OF PRESENT ILLNESS: This is an 81 year old male followed with a history of intermittent urinary retention. He was hospitalized in September with acute renal failure and urinary retention. He was seen in our office on follow-up for catheter removal. He returned several times complaining of inability to void, however, his bladder residual by ultrasound was only 150 mL. Catheter placement was not recommended. He was also seen by [facility]Fitzgerald [facility] and a catheter was placed with a volume of urine of only 150 mL returned. Recent cystoscopy showed no significant prostatic enlargement or stricture. He was placed on Myrbetriq. He was scheduled for a video urodynamic study because of persistent voiding symptoms and no objective evidence of obstruction on cystoscopy. However, this appointment was yesterday. He presented to the Emergency Department on 10/06 complaining of nausea, instability, and urinary difficulties. He has chronic renal insufficiency with a baseline creatinine of 1.8. His creatinine in the Emergency Department was 2.6. An estimated bladder volume by bladder scan was 399 mL and a Foley catheter was placed, however, the volume of urine return is not recorded. His creatinine yesterday was 2.2 and today was 2.4. He has no complaints with the indwelling Foley.   PAST MEDICAL HISTORY:  1. Chronic kidney disease.  2. Anemia.  3. Hypertension.  4. Diabetes mellitus.   ALLERGIES: No known drug allergies.   MEDICATIONS ON ADMISSION:  1. Verapamil 240 mg. 2. Uroxatral 10 mg daily.  3. MiraLAX 17 grams daily.  4. Lisinopril 20 mg daily.  5. Finasteride 5 mg daily.  6. Merbetric 25 mg daily.   SOCIAL HISTORY: The patient  is retired. Denies alcohol or tobacco use.   REVIEW OF SYSTEMS: Otherwise noncontributory except as per the history of present illness.   PHYSICAL EXAMINATION:   VITAL SIGNS: Temperature 97.8, blood pressure 135/69, pulse 82.   GENERAL: The patient is in no acute distress.   ABDOMEN: Soft, nontender.   LABORATORY, DIAGNOSTIC, AND RADIOLOGICAL DATA: Urinalysis on admission was unremarkable. Creatinine this morning 2.46.   IMPRESSION:  1. Urinary retention of undetermined etiology. He had no significant prostatic enlargement on recent cystoscopy.  2. Acute on chronic renal failure. This is unlikely related to his urinary retention as he has had persistent elevation of his creatinine after Foley catheter drainage.   RECOMMENDATIONS:  1. Renal ultrasound was ordered.  2. If no significant abnormality on ultrasound, he is okay for discharge with an indwelling Foley.  3. Follow-up in our office in approximately seven days. His video urodynamic study will be rescheduled.   ____________________________ Verna CzechScott C. Lonna CobbStoioff, MD scs:drc D: 07/09/2012 13:17:38 ET T: 07/09/2012 13:26:40 ET JOB#: [id number]  cc: Lorin PicketScott C. Lonna CobbStoioff, MD, <Dictator> Riki AltesSCOTT C Athan Casalino MD ELECTRONICALLY SIGNED 07/10/2012 15:27

## 2015-01-19 NOTE — Discharge Summary (Signed)
PATIENT NAME:  Timothy Ramos, Timothy Ramos MR#:  [id number] DATE OF BIRTH:  01/13/1923  DATE OF ADMISSION:  06/07/2012 DATE OF DISCHARGE:  06/09/2012  DISCHARGE DIAGNOSES:  1. Acute on chronic renal failure.  2. Hyperkalemia, resolved.  3. Hypertension.  4. History of urinary retention with Foley in place.   DISCHARGE MEDICATIONS:  1. Aspirin 81 mg p.o. daily. 2. Finasteride 5 mg p.o. daily.  3. Lisinopril 20 mg p.o. daily.  4. Uroxatral 10 mg p.o. extended release daily.  5. Verapamil 240 mg p.o. daily.   CONSULTS: None.   PROCEDURES: He had a renal ultrasound that showed no obstruction.   PERTINENT LABS ON DAY OF DISCHARGE:  Sodium 136, potassium 4.5, creatinine 1.67 and BUN 22. Urinalysis was negative.   BRIEF HOSPITAL COURSE:  Acute on chronic renal failure. The patient initially came in with a creatinine of 2.25 with a baseline around 1.8. Has a history of urinary retention with a Foley in place. Also was noted to have hyperkalemia with a potassium of 6. He was placed on IV fluids. Renal ultrasound was obtained which showed no obstruction. Urinalysis was negative for infection. The patient was essentially asymptomatic with no changes in his mental status. After two days of IV fluids his potassium came down to 4.5 and his creatinine was 1.67 and his Foley was draining as expected. The plan is to discharge him with instructions in hydration.   FOLLOW-UP: Follow-up with Dr. Gilford Rile in 1 to 2 weeks.   DISCHARGE INSTRUCTIONS: Repeat a MET-B in one week to reassess the potassium and creatinine. He will need to follow-up with urology for this urinary retention.  DISPOSITION: The patient is in stable condition to be discharged to home.    ____________________________ Dion Body, MD kl:rbg D: 06/09/2012 09:40:25 ET T: 06/11/2012 09:05:29 ET JOB#: [id number]  cc: Dion Body, MD, <Dictator> John B. Sarina Ser, MD Dion Body MD ELECTRONICALLY SIGNED 06/25/2012 8:12

## 2015-01-19 NOTE — Discharge Summary (Signed)
PATIENT NAME:  Timothy Ramos, Arush W MR#:  [id number] DATE OF BIRTH:  [birth year]  DATE OF ADMISSION:  07/07/2012 DATE OF DISCHARGE:  07/09/2012  HISTORY OF PRESENT ILLNESS: [name] is an 81 year old gentleman with a history of post obstructive uropathy who presented with increasing renal insufficiency, nausea and vomiting. The patient had originally been hospitalized and had a catheter placed for similar symptoms. His urinary function had improved as had his kidney function. His electrolyte abnormalities had cleared. His catheter had been removed approximately a week ago by his urologist. His creatinine had gone back to 2.6 and his sodium had dropped down to 120.   PAST MEDICAL HISTORY:  1. Chronic renal insufficiency.  2. Anemia of chronic disease. 3. Essential hypertension. 4. Diet controlled diabetes.   ADMISSION MEDICATIONS:  1. Lisinopril 20 mg daily.  2. Uroxatral 10 mg daily.  3. Proscar 5 mg daily.  4. Aspirin 81 mg daily.  5. Vitamin D 5000 units daily.  6. Verapamil ER 240 mg daily.   ADMISSION PHYSICAL EXAMINATION: Normal vital signs. There was a grade 2/6 systolic murmur noted at the apex. He had mild suprapubic tenderness. The remainder of the examination was relatively unremarkable as per the admitting physician.   LABORATORY, DIAGNOSTIC AND RADIOLOGIC DATA: Patient's admission CBC showed a hemoglobin of 10.5 with a hematocrit of 28.5. White count was 5200. Platelet count was 141,000. Admission chemistry panel showed a BUN of 25 with a creatinine of 2.61. Sodium was 123. Chloride was 90. CO2 was 20. Total protein was 6.2. Estimated GFR was 21.   HOSPITAL COURSE: Patient was admitted to the regular floor where he was rehydrated with IV fluids. A Foley catheter was reinserted. Following relief of his post obstructive uropathy his sodium came up to 131. His potassium was 5.3. Creatinine improved a little bit to 2.46. Estimated GFR was 23.   DISCHARGE DIAGNOSES:  1. Post obstructive  renal failure.  2. Hypertension.  3. Diabetes.   DISCHARGE MEDICATIONS:  1. Ecotrin 81 mg daily.  2. Proscar 5 mg daily.  3. Uroxatral 10 mg daily.  4. Verapamil ER 240 mg daily.  5. Vitamin D 5000 units daily.  6. Zofran 4 mg every eight hours as needed for nausea.   DISCHARGE INSTRUCTIONS: The patient was discharged on a low sodium diet with activity as tolerated. Because of his elevated potassium his ACE inhibitor was discontinued.   FOLLOW UP: The patient is to be followed up by Dr. Lonna CobbStoioff in approximately 1 to 2 weeks. He is to keep his regular appointment for follow up on his hypertension and diabetes with me.   ____________________________ Letta PateJohn B. Danne HarborWalker III, MD jbw:cms D: 07/23/2012 05:24:29 ET T: 07/23/2012 09:30:14 ET JOB#: [id number]  cc: Letta PateJohn B. Danne HarborWalker III, MD, <Dictator> Elmo PuttJOHN B WALKER III MD ELECTRONICALLY SIGNED 07/24/2012 7:58

## 2015-01-19 NOTE — H&P (Signed)
PATIENT NAME:  Timothy GlassMARLOW, Kamoni W MR#:  [id number] DATE OF BIRTH:  05/01/1923  DATE OF ADMISSION:  07/07/2012  HISTORY OF PRESENT ILLNESS: 81 year old male presents with nausea, vomiting, suprapubic pain and progressive laboratory abnormalities. He has known obstructive uropathy, was hospitalized a month or so ago with uropathy, hyponatremia. Had a urine catheter in at that time. His creatinine was 2.6, over time has come down to 1.7. His sodium had gone from 123 up to 132, however, in the last 10 days all that has worsened with his creatinine back to 2.6, sodium down to 120. No fever, chills. Appetite is poor, just feels very nauseous, very unstable on his feet. He will be admitted for further evaluation and treatment.   PAST MEDICAL HISTORY:  1. Chronic renal insufficiency. 2. Anemia of chronic disease. 3. Hypertension. 4. Diabetes mellitus, diet controlled.   ALLERGIES: None.  MEDICATIONS:  1. Lisinopril 20 mg daily.  2. Uroxatral 10 mg daily.  3. Proscar 5 mg daily.  4. Aspirin 81 mg daily.  5. Vitamin D 5000 units daily.  6. Verapamil ER 240 mg daily.   SOCIAL HISTORY: Widowed, retired. No alcohol.   FAMILY HISTORY: Father died of cerebrovascular accident.   REVIEW OF SYSTEMS: Otherwise negative.   PHYSICAL EXAMINATION: VITAL SIGNS: Blood pressure 135/70, pulse 80 regular.   NECK: No thyromegaly or bruits.   LUNGS: Clear to auscultation and percussion.   HEART: Regular rhythm, 2/6 systolic murmur at apex noted.   ABDOMEN: Mild suprapubic tenderness.   EXTREMITIES: No edema. Good peripheral pulses.   NEUROLOGIC: Very unstable on his feet with dysmetria and ataxia. No focal deficits.   LABORATORY, DIAGNOSTIC AND RADIOLOGICAL DATA: Sodium 123, creatinine 2.6, baseline of 1.7. Troponins minimally elevated at 0.08. Hemoglobin 10.8. Urinalysis essentially unremarkable.   ASSESSMENT AND PLAN:  1. Hyponatremia-Likely due to obstructive uropathy with acute on chronic renal  failure due to the obstructive uropathy. Foley will be placed. Urology consulted, likely will need some physical treatment such as a transurethral resection of the prostate, etc. to the prostate. Normal saline will be given. Nausea likely due to the hyponatremia as well as the renal dysfunction. No clear sign for urinary tract infections to indicate antibiotics.  2. Hypertension-Off lisinopril with the renal dysfunction continue verapamil. 3. Diabetes-Will follow his sugars. No sign for acute coronary syndrome.  ____________________________ Danella PentonMark F. Maneh Sieben, MD mfm:cms D: 07/07/2012 08:35:51 ET T: 07/07/2012 10:10:41 ET JOB#: [id number]  cc: Danella PentonMark F. Twilia Yaklin, MD, <Dictator> John B. Danne HarborWalker III, MD Laramie Meissner Sherlene ShamsF Harutyun Monteverde MD ELECTRONICALLY SIGNED 07/10/2012 7:49

## 2015-01-22 NOTE — Discharge Summary (Signed)
PATIENT NAME:  Timothy Ramos, Timothy Ramos MR#:  [id number] DATE OF BIRTH:  1922-11-24  DATE OF ADMISSION:  09/21/2012 DATE OF DISCHARGE:  09/26/2012  HISTORY OF PRESENT ILLNESS: Timothy Ramos is an 81 year old white gentleman who had just been discharged from the hospital following an admission for profound hyponatremia due to nausea and vomiting. The patient had gone home with home health which apparently had not been set up yet. He fell at home and came back to the Emergency Room where CT scan showed a comminuted fracture of the right acetabulum and a nondisplaced fracture of the right inferior pubic rami. The patient was therefore admitted for pain management.   PAST MEDICAL HISTORY: The patient's past medical history was notable for hypertension, BPH, anemia of chronic disease, diet-controlled diabetes mellitus, chronic renal insufficiency and a history of frequent falls. The patient was also known to have a permanent urinary catheter. He had failed having it removed once before. He was supposed to go back to see his urologist on January 7th to consider having it taken out again.   DRUG ALLERGIES: The patient had no known drug allergies.  ADMISSION MEDICATIONS: The patient's medications on admission included lisinopril 20 mg daily, vitamin B12 1 tablet daily, iron sulfate 325 mg daily, Ecotrin 1 tablet daily and vitamin D3 5000 units daily.   ADMISSION PHYSICAL EXAMINATION: The patient's admission physical examination revealed a temperature of 97.9, a pulse of 72, a respiratory rate of 35, a blood pressure of 148/65 and a pulse oximetry of 96% on room air. Examination as described by the admitting physician was unremarkable, except for the patient's sallow appearance and his hip pain.   LABS/RADIOLOGIC STUDIES: The patient's admission CBC showed a hemoglobin of 8.3 with a hematocrit of 23.9. Admission comprehensive metabolic panel showed a BUN of 25 with a creatinine 1.69. Sodium was 125. Chloride was 96. Total  protein was 5.7. Albumin was 3.3. Estimated GFR was 36. Vitamin B12 level was 818.   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   HOSPITAL COURSE: The patient was admitted to the regular medical floor where he was placed at bed rest. He was seen in consultation by orthopedics who did not feel that he needed surgery and recommended bed rest and pain management. The patient's hospital course was otherwise relatively unremarkable, except that on one occasion his hemoglobin dropped as low as 7.7. There was no evidence of active bleeding. Repeat hemoglobin was 8. The patient's final CBC showed a hemoglobin of 8 with a hematocrit of 22.7. His final metabolic panel showed a blood sugar of 93, a BUN of 32, a creatinine of 1.56, a sodium of 132 and an estimated GFR of 39.   DISCHARGE DIAGNOSES: 1. Comminuted fracture of the right acetabulum.  2. Nondisplaced fracture of the right inferior pubic ramus.  3. Nondisplaced fracture at the junction of the acetabulum and the superior pubic ramus.   DISCHARGE MEDICATIONS: 1. Enteric-coated aspirin 81 mg daily.  2. Vitamin B12 5000 mcg daily.  3. Ferrous sulfate 325 mg daily.  4. Zantac 150 mg twice a day. 5. Vitamin D3 2000 units daily.  6. Norco 5/325 mg 1 tablet every 4 hours as needed for pain.   DISCHARGE DISPOSITION: The patient was discharged on a 2 gram sodium, 1800 calorie ADA diet. During his hospitalization, he remained relatively normotensive and was not placed back on his blood pressure medication. Once he is more ambulatory it may have to be restarted.  The patient will be reevaluated by PT. It is assumed that he will  eventually return home with home health. The patient is a FULL CODE. ____________________________ Letta PateJohn B. Danne HarborWalker III, MD jbw:sb D: 09/26/2012 07:29:02 ET T: 09/26/2012 07:52:57 ET JOB#: [id number]  cc: Letta PateJohn B. Danne HarborWalker III, MD, <Dictator> Elmo PuttJOHN B WALKER III MD ELECTRONICALLY SIGNED  10/02/2012 20:28

## 2015-01-22 NOTE — H&P (Signed)
PATIENT NAME:  Timothy Ramos, Timothy Ramos MR#:  [id number] DATE OF BIRTH:  09-27-1923  DATE OF ADMISSION:  09/16/2012  CHIEF COMPLAINT:  Weakness, unsteadiness, recurrent falls, hyponatremia.   HISTORY OF PRESENT ILLNESS:  The patient is an 81 year old who was seen by Dr. Dan Humphreys in his office and was noted to have a low sodium of 117 and subsequently sent to the Emergency Room for further evaluation. He has been complaining of nausea for the last week or so. Also reports unsteadiness associated with weakness. The patient denies any chest pain or shortness of breath. No neck pain or arm pain. No ankle edema. In the ER, he had an elevated troponin of 27.  The patient is hard of hearing. He has had a Foley catheter for the last 2 months and he is being evaluated by urologist, Dr. Patsi Sears in [employer] and also by Dr. Lonna Cobb.   PAST MEDICAL HISTORY:  Significant for hypertension, BPH, chronic renal insufficiency, anemia of chronic disease and diet-controlled diabetes.   CURRENT MEDICATIONS:  Include lisinopril 20 mg once a day, finasteride 5 mg once a day, Ecotrin 81 mg once a day, vitamin D 5000 units once a day, verapamil extended release 240 mg a day.   ALLERGIES:  The patient denies any known drug allergies.   SOCIAL HISTORY:  He has been widowed for the last 5 years. He is retired and used to assemble gas pumps. The patient denies any use of tobacco or alcohol.   FAMILY HISTORY:  Father died of CVA.  REVIEW OF SYSTEMS:  The patient reports an 8-pound weight loss in the last 6 months. He also has been having poor appetite, bowels seem to be constipated. Vision is okay. Has had recurrent falls and fell approximately 3 weeks back and sustained abrasions to his right arm and right elbow. The patient also states that he fell over the weekend and it is unclear how many hours he was down. He is hard of hearing.   PHYSICAL EXAMINATION:  VITAL SIGNS:  Temperature was 98.3, pulse was 69, respirations 24, blood  pressure 178/72, pulse ox 99% on room air. HEENT:  The patient is hard of hearing. No JVD.  HEART:  S1, S2.  LUNGS:  Decreased air entry bilaterally.  NECK:  Supple. No JVD.  ABDOMEN:  Soft, nontender. No masses felt.  EXTREMITIES:  No edema. Evidence of large abrasion noted on the right forearm. Also has multiple ecchymotic areas in the right elbow and low back.  NEUROLOGICALLY:  The patient alert and oriented. No obvious focal deficits.   EKG:  Sinus rhythm with septal Q waves.   LABORATORY DATA:  Glucose 101, BUN 21, creatinine 2.6, sodium 119, potassium 4.8, chloride 90, CO2 of 19. Alkaline phosphatase 103, ALT of 23, AST of 70, total protein 6.2, albumin 3.5, GFR was 21, anion gap 10. Troponin-I of 27. WBC 5.6, hemoglobin 9.8, hematocrit 28.5, platelets 150. PTT 25.3. Urinalysis: Less than 1 RBC, WBCs 1 per high-power field.   IMPRESSION:  1.  Elevated troponin, most likely non-ST-elevation myocardial infarction. Will also obtain CPK and MB fractions every 8 hours. Will consult Cardiology. Will start him on IV heparin.  2.  Hyponatremia. Will give normal saline bolus followed by 75 mL an hour and recheck sodium in a.m.  3.  Weakness with unsteadiness. Will consult Physical Therapy. 4.  Chronic kidney disease with benign prostatic hypertrophy and indwelling Foley. Will continue to monitor renal function and consult Urology as needed.  5.  Anemia of chronic disease, may be contributing to his overall sense of weakness and unsteadiness.  6.  Hypertension. Will resume his home medications including lisinopril and verapamil. Will check B12, folic acid and iron panel. Will also obtain echocardiogram.   I discussed the plan with the patient and daughter who are both in agreement.     ____________________________ Barbette ReichmannVishwanath Finnegan Gatta, MD vh:es D: 09/16/2012 19:56:17 ET T: 09/17/2012 12:26:10 ET JOB#: [id number]  cc: Barbette ReichmannVishwanath Catrell Morrone, MD, <Dictator> Barbette ReichmannVISHWANATH Aslyn Cottman MD ELECTRONICALLY SIGNED  10/17/2012 8:26

## 2015-12-07 ENCOUNTER — Encounter: Payer: Self-pay | Admitting: Emergency Medicine

## 2015-12-07 ENCOUNTER — Emergency Department
Admission: EM | Admit: 2015-12-07 | Discharge: 2015-12-07 | Disposition: A | Discharge status: Skilled Nursing Facility | Payer: Medicare Other | Source: Home / Self Care | Attending: Emergency Medicine | Admitting: Emergency Medicine

## 2015-12-07 ENCOUNTER — Emergency Department
Admission: EM | Admit: 2015-12-07 | Discharge: 2015-12-07 | Disposition: A | Discharge status: Home / Self Care | Payer: Medicare Other | Attending: Emergency Medicine | Admitting: Emergency Medicine

## 2015-12-07 ENCOUNTER — Emergency Department: Payer: Medicare Other

## 2015-12-07 DIAGNOSIS — E119 Type 2 diabetes mellitus without complications: Secondary | ICD-10-CM | POA: Insufficient documentation

## 2015-12-07 DIAGNOSIS — W19XXXA Unspecified fall, initial encounter: Secondary | ICD-10-CM

## 2015-12-07 DIAGNOSIS — J189 Pneumonia, unspecified organism: Secondary | ICD-10-CM | POA: Diagnosis not present

## 2015-12-07 DIAGNOSIS — I129 Hypertensive chronic kidney disease with stage 1 through stage 4 chronic kidney disease, or unspecified chronic kidney disease: Secondary | ICD-10-CM | POA: Insufficient documentation

## 2015-12-07 DIAGNOSIS — R7989 Other specified abnormal findings of blood chemistry: Secondary | ICD-10-CM | POA: Diagnosis not present

## 2015-12-07 DIAGNOSIS — S0990XA Unspecified injury of head, initial encounter: Secondary | ICD-10-CM

## 2015-12-07 DIAGNOSIS — W1849XD Other slipping, tripping and stumbling without falling, subsequent encounter: Secondary | ICD-10-CM | POA: Insufficient documentation

## 2015-12-07 DIAGNOSIS — N184 Chronic kidney disease, stage 4 (severe): Secondary | ICD-10-CM | POA: Insufficient documentation

## 2015-12-07 DIAGNOSIS — S0083XD Contusion of other part of head, subsequent encounter: Secondary | ICD-10-CM | POA: Insufficient documentation

## 2015-12-07 DIAGNOSIS — Z7982 Long term (current) use of aspirin: Secondary | ICD-10-CM | POA: Insufficient documentation

## 2015-12-07 DIAGNOSIS — S0990XD Unspecified injury of head, subsequent encounter: Secondary | ICD-10-CM | POA: Diagnosis present

## 2015-12-07 LAB — BASIC METABOLIC PANEL
Anion gap: 7 (ref 5–15)
BUN: 35 mg/dL — ABNORMAL HIGH (ref 6–20)
CALCIUM: 10.4 mg/dL — AB (ref 8.9–10.3)
CO2: 23 mmol/L (ref 22–32)
CREATININE: 1.82 mg/dL — AB (ref 0.61–1.24)
Chloride: 101 mmol/L (ref 101–111)
GFR calc non Af Amer: 31 mL/min — ABNORMAL LOW (ref >60)
GFR, EST AFRICAN AMERICAN: 36 mL/min — AB (ref >60)
Glucose, Bld: 108 mg/dL — ABNORMAL HIGH (ref 65–99)
Potassium: 4.1 mmol/L (ref 3.5–5.1)
SODIUM: 131 mmol/L — AB (ref 135–145)

## 2015-12-07 LAB — CBC WITH DIFFERENTIAL/PLATELET
Basophils Absolute: 0 10*3/uL (ref 0–0.1)
EOS ABS: 0 10*3/uL (ref 0–0.7)
HCT: 34.8 % — ABNORMAL LOW (ref 40.0–52.0)
Hemoglobin: 12.1 g/dL — ABNORMAL LOW (ref 13.0–18.0)
Lymphocytes Relative: 9 %
Lymphs Abs: 0.3 10*3/uL — ABNORMAL LOW (ref 1.0–3.6)
MCH: 33.2 pg (ref 26.0–34.0)
MCHC: 34.8 g/dL (ref 32.0–36.0)
MCV: 95.5 fL (ref 80.0–100.0)
MONO ABS: 0.4 10*3/uL (ref 0.2–1.0)
Neutro Abs: 2.4 10*3/uL (ref 1.4–6.5)
PLATELETS: 94 10*3/uL — AB (ref 150–440)
RBC: 3.65 MIL/uL — ABNORMAL LOW (ref 4.40–5.90)
RDW: 12.7 % (ref 11.5–14.5)
WBC: 3.1 10*3/uL — ABNORMAL LOW (ref 3.8–10.6)

## 2015-12-07 LAB — URINALYSIS COMPLETE WITH MICROSCOPIC (ARMC ONLY)
BILIRUBIN URINE: NEGATIVE
Bacteria, UA: NONE SEEN
Glucose, UA: NEGATIVE mg/dL
HGB URINE DIPSTICK: NEGATIVE
KETONES UR: NEGATIVE mg/dL
LEUKOCYTES UA: NEGATIVE
Nitrite: NEGATIVE
PH: 7 (ref 5.0–8.0)
Protein, ur: 100 mg/dL — AB
SQUAMOUS EPITHELIAL / LPF: NONE SEEN
Specific Gravity, Urine: 1.013 (ref 1.005–1.030)

## 2015-12-07 NOTE — ED Provider Notes (Signed)
[facility] Emergency Department Provider Note    ____________________________________________  Time seen: On EMS arrival  I have reviewed the triage vital signs and the nursing notes.   HISTORY  Chief Complaint Fall   History limited by: Not Limited   HPI ELISON WORREL is a 82 y.o. male who presents to the emergency department today via EMS because of a fall. Patient states he was in the bathroom when he slipped to the ground. He states he did not fall. He states that his feet went out from under him. He denies hitting his head. Denies loss of consciousness. Denies any pain. Denies any recent fevers chest pain or shortness breath.  The patient was seen earlier in the day because of a fall. He did suffer a small hematoma. He underwent a CT scan at that time which was negative.     Past Medical History  Diagnosis Date  . HTN (hypertension) 1992  . Renal insufficiency 04/1999    CDKIII-IV w CR ~ 2 per [facility]  . Diabetes mellitus, type 2 (HCC) 05/07/2002  . Gastroduodenitis 9/18.2000    EGD mild  . Intermittent vertigo 2011    better w physical therapy  . BPH (benign prostatic hyperplasia)     followed by urology   . Hyperlipidemia   . Anemia     due to renal disease per Dr Park Breed with heme    Patient Active Problem List   Diagnosis Date Noted  . DIZZINESS 05/13/2010  . NAUSEA 05/06/2010  . OSTEOPENIA 06/17/2008  . VITAMIN D DEFICIENCY 12/11/2007  . DYSLIPIDEMIA 11/27/2007  . HEARING LOSS 11/27/2007  . CAD 11/27/2007  . GASTRITIS, ANTRAL 11/27/2007  . ANEMIA NOS 04/26/2007  . HYPERTENSION, BENIGN 04/19/2007  . HYPERTROPHY PROSTATE BNG W/URINARY OBST/LUTS 04/02/2007  . FATIGUE, ACUTE 04/02/2007  . ELEVATED C-REACTIVE PROTEIN 03/19/2007  . HYPERPARATHYROIDISM, SECONDARY 03/06/2007  . Nocturia 03/06/2007  . ALLERGY 03/06/2007  . DIABETES MELLITUS, TYPE II 05/07/2002  . HIATAL HERNIA 06/20/1999    Past Surgical History  Procedure  Laterality Date  . Tonsillectomy  1949  . Nasal polyp surgery  1949  . Colonoscopy  06/20/1999    int hemms  . Esophagogastroduodenoscopy  06/20/1999     mild gastroduodenitis  . 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 Abd u/s  01/11/2010    nml    Current Outpatient Rx  Name  Route  Sig  Dispense  Refill  . acetaminophen (TYLENOL) 325 MG tablet   Oral   Take 650 mg by mouth every 6 (six) hours as needed for mild pain, fever or headache.         Marland Kitchen acidophilus (RISAQUAD) CAPS capsule   Oral   Take 1 capsule by mouth daily.         Marland Kitchen alum & mag hydroxide-simeth (MAALOX PLUS) 400-400-40 MG/5ML suspension   Oral   Take 30 mLs by mouth every 4 (four) hours as needed for indigestion.         Marland Kitchen aspirin EC 81 MG tablet   Oral   Take 81 mg by mouth daily.         . carbamide peroxide (DEBROX) 6.5 % otic solution   Both Ears   Place 5 drops into both ears 2 (two) times daily as needed (for ear itching/ear wax).         . cholecalciferol (VITAMIN D) 1000 UNITS tablet  Oral   Take 2,000 Units by mouth daily.          . cloNIDine (CATAPRES) 0.1 MG tablet   Oral   Take 0.1 mg by mouth every 12 (twelve) hours as needed (for elevated BP).         Marland Kitchen divalproex (DEPAKOTE SPRINKLE) 125 MG capsule   Oral   Take 125-250 mg by mouth 2 (two) times daily. Pt takes one capsule in the morning and two at bedtime.         Marland Kitchen esomeprazole (NEXIUM) 40 MG capsule   Oral   Take 40 mg by mouth 2 (two) times daily before a meal.         . guaifenesin (ROBITUSSIN) 100 MG/5ML syrup   Oral   Take 100 mg by mouth every 4 (four) hours as needed for cough.         Marland Kitchen HYDROcodone-acetaminophen (NORCO/VICODIN) 5-325 MG tablet   Oral   Take 1 tablet by mouth every 4 (four) hours as needed for moderate pain.         Marland Kitchen lisinopril (PRINIVIL,ZESTRIL) 10 MG tablet   Oral   Take 10 mg by mouth daily.         . magnesium hydroxide (MILK OF  MAGNESIA) 400 MG/5ML suspension   Oral   Take 30 mLs by mouth daily as needed for mild constipation.         . Multiple Vitamin (MULTIVITAMIN WITH MINERALS) TABS tablet   Oral   Take 1 tablet by mouth daily.         . nitroGLYCERIN (NITROSTAT) 0.4 MG SL tablet   Sublingual   Place 0.4 mg under the tongue every 5 (five) minutes as needed for chest pain.         Marland Kitchen senna-docusate (SENOKOT-S) 8.6-50 MG tablet   Oral   Take 1 tablet by mouth at bedtime.         . simethicone (MYLICON) 80 MG chewable tablet   Oral   Chew 80 mg by mouth every 6 (six) hours as needed for flatulence.         . sucralfate (CARAFATE) 1 g tablet   Oral   Take 1 g by mouth 4 (four) times daily -  with meals and at bedtime.           Allergies Doxazosin mesylate; Hydrochlorothiazide w-triamterene; Penicillins; and Simvastatin  Family History  Problem Relation Age of Onset  . Stroke Father   . Stroke Sister   . Stroke Sister   . Pneumonia Sister     after fx hip    Social History Social History  Substance Use Topics  . Smoking status: Never Smoker   . Smokeless tobacco: None     Comment: Remote tobacco   . Alcohol Use: No    Review of Systems  Constitutional: Negative for fever. Cardiovascular: Negative for chest pain. Respiratory: Negative for shortness of breath. Gastrointestinal: Negative for abdominal pain, vomiting and diarrhea. Neurological: Negative for headaches, focal weakness or numbness.   10-point ROS otherwise negative.  ____________________________________________   PHYSICAL EXAM:  VITAL SIGNS: ED Triage Vitals  Enc Vitals Group     BP 12/07/15 2017 140/63 mmHg     Pulse Rate 12/07/15 2017 75     Resp 12/07/15 2017 18     Temp 12/07/15 2017 98.6 F (37 C)     Temp Source 12/07/15 2017 Oral     SpO2 12/07/15 2017 90 %  Weight 12/07/15 2017 163 lb (73.936 kg)     Height --      Head Cir --      Peak Flow --      Pain Score 12/07/15 2018 0    Constitutional: Alert and oriented. Well appearing and in no distress. Eyes: Conjunctivae are normal. PERRL. Normal extraocular movements. ENT   Head: Normocephalic. Small hematoma of the forehead with Steri-Strips in place.   Nose: No congestion/rhinnorhea.   Mouth/Throat: Mucous membranes are moist.   Neck: No stridor. Hematological/Lymphatic/Immunilogical: No cervical lymphadenopathy. Cardiovascular: Normal rate, regular rhythm.  No murmurs, rubs, or gallops. Respiratory: Normal respiratory effort without tachypnea nor retractions. Breath sounds are clear and equal bilaterally. No wheezes/rales/rhonchi. Gastrointestinal: Soft and nontender. No distention.  Genitourinary: Deferred Musculoskeletal: Normal range of motion in all extremities. No joint effusions.  No lower extremity tenderness nor edema. Neurologic:  Normal speech and language. No gross focal neurologic deficits are appreciated.  Skin:  Skin is warm, dry and intact. No rash noted. Psychiatric: Mood and affect are normal. Speech and behavior are normal. Patient exhibits appropriate insight and judgment.  ____________________________________________    LABS (pertinent positives/negatives) Labs Reviewed  URINALYSIS COMPLETEWITH MICROSCOPIC ([facility] ONLY) - Abnormal; Notable for the following:    Color, Urine YELLOW (*)    APPearance CLEAR (*)    Protein, ur 100 (*)    All other components within normal limits  CBC WITH DIFFERENTIAL/PLATELET - Abnormal; Notable for the following:    WBC 3.1 (*)    RBC 3.65 (*)    Hemoglobin 12.1 (*)    HCT 34.8 (*)    Platelets 94 (*)    Lymphs Abs 0.3 (*)    All other components within normal limits  BASIC METABOLIC PANEL - Abnormal; Notable for the following:    Sodium 131 (*)    Glucose, Bld 108 (*)    BUN 35 (*)    Creatinine, Ser 1.82 (*)    Calcium 10.4 (*)    GFR calc non Af Amer 31 (*)    GFR calc Af Amer 36 (*)    All other components within normal limits      ____________________________________________   EKG  None  ____________________________________________    RADIOLOGY  None  ____________________________________________   PROCEDURES  Procedure(s) performed: None  Critical Care performed: No  ____________________________________________   INITIAL IMPRESSION / ASSESSMENT AND PLAN / ED COURSE  Pertinent labs & imaging results that were available during my care of the patient were reviewed by me and considered in my medical decision making (see chart for details).  Patient presented to the emergency department today because of concerns for a fall. This is his second visit today for a fall. Because of this blood work and urine were checked. Patient did have a decrease in white blood cells as well as platelets. Appears the patient has had some chronic issues with thrombocytopenia however are last blood work was from 2013. This point I doubt the blood work or presents any acute process. Will plan on discharging him back to follow-up with primary care.   ____________________________________________   FINAL CLINICAL IMPRESSION(S) / ED DIAGNOSES  Final diagnoses:  Fall, initial encounter     Phineas SemenGraydon Violia Knopf, MD 12/07/15 2316

## 2015-12-07 NOTE — ED Notes (Signed)
MD at bedside. 

## 2015-12-07 NOTE — Discharge Instructions (Signed)
Please have Timothy Ramos be seen by his primary care doctor for a repeat blood work. Please seek medical attention for any high fevers, chest pain, shortness of breath, change in behavior, persistent vomiting, bloody stool or any other new or concerning symptoms.  Fall Prevention in the Home  Falls can cause injuries and can affect people from all age groups. There are many simple things that you can do to make your home safe and to help prevent falls. WHAT CAN I DO ON THE OUTSIDE OF MY HOME?  Regularly repair the edges of walkways and driveways and fix any cracks.  Remove high doorway thresholds.  Trim any shrubbery on the main path into your home.  Use bright outdoor lighting.  Clear walkways of debris and clutter, including tools and rocks.  Regularly check that handrails are securely fastened and in good repair. Both sides of any steps should have handrails.  Install guardrails along the edges of any raised decks or porches.  Have leaves, snow, and ice cleared regularly.  Use sand or salt on walkways during winter months.  In the garage, clean up any spills right away, including grease or oil spills. WHAT CAN I DO IN THE BATHROOM?  Use night lights.  Install grab bars by the toilet and in the tub and shower. Do not use towel bars as grab bars.  Use non-skid mats or decals on the floor of the tub or shower.  If you need to sit down while you are in the shower, use a plastic, non-slip stool.Marland Kitchen.  Keep the floor dry. Immediately clean up any water that spills on the floor.  Remove soap buildup in the tub or shower on a regular basis.  Attach bath mats securely with double-sided non-slip rug tape.  Remove throw rugs and other tripping hazards from the floor. WHAT CAN I DO IN THE BEDROOM?  Use night lights.  Make sure that a bedside light is easy to reach.  Do not use oversized bedding that drapes onto the floor.  Have a firm chair that has side arms to use for getting  dressed.  Remove throw rugs and other tripping hazards from the floor. WHAT CAN I DO IN THE KITCHEN?   Clean up any spills right away.  Avoid walking on wet floors.  Place frequently used items in easy-to-reach places.  If you need to reach for something above you, use a sturdy step stool that has a grab bar.  Keep electrical cables out of the way.  Do not use floor polish or wax that makes floors slippery. If you have to use wax, make sure that it is non-skid floor wax.  Remove throw rugs and other tripping hazards from the floor. WHAT CAN I DO IN THE STAIRWAYS?  Do not leave any items on the stairs.  Make sure that there are handrails on both sides of the stairs. Fix handrails that are broken or loose. Make sure that handrails are as long as the stairways.  Check any carpeting to make sure that it is firmly attached to the stairs. Fix any carpet that is loose or worn.  Avoid having throw rugs at the top or bottom of stairways, or secure the rugs with carpet tape to prevent them from moving.  Make sure that you have a light switch at the top of the stairs and the bottom of the stairs. If you do not have them, have them installed. WHAT ARE SOME OTHER FALL PREVENTION TIPS?  Wear closed-toe  shoes that fit well and support your feet. Wear shoes that have rubber soles or low heels.  When you use a stepladder, make sure that it is completely opened and that the sides are firmly locked. Have someone hold the ladder while you are using it. Do not climb a closed stepladder.  Add color or contrast paint or tape to grab bars and handrails in your home. Place contrasting color strips on the first and last steps.  Use mobility aids as needed, such as canes, walkers, scooters, and crutches.  Turn on lights if it is dark. Replace any light bulbs that burn out.  Set up furniture so that there are clear paths. Keep the furniture in the same spot.  Fix any uneven floor surfaces.  Choose a  carpet design that does not hide the edge of steps of a stairway.  Be aware of any and all pets.  Review your medicines with your healthcare provider. Some medicines can cause dizziness or changes in blood pressure, which increase your risk of falling. Talk with your health care provider about other ways that you can decrease your risk of falls. This may include working with a physical therapist or trainer to improve your strength, balance, and endurance.   This information is not intended to replace advice given to you by your health care provider. Make sure you discuss any questions you have with your health care provider.   Document Released: 09/08/2002 Document Revised: 02/02/2015 Document Reviewed: 10/23/2014 Elsevier Interactive Patient Education [employer]

## 2015-12-07 NOTE — ED Notes (Signed)
Per EMS; Was just seen today for fall, released back to [facility] after having CT done and sutures to forehead.  Larey SeatFell again after slipping in urine near his bed.  Urinal beside bed, patient states "my feet let me go."

## 2015-12-07 NOTE — ED Provider Notes (Signed)
Time Seen: Approximately *1615  I have reviewed the triage notes  Chief Complaint: Fall   History of Present Illness: Timothy Ramos is a 82 y.o. male who states he was in a wheelchair today and bent over to pick up something that was near his shoe and fell forward striking the front of his head. He has a small hematoma above the right eye. He denies any visual disturbances. Denies any neck pain, thoracic, lumbar spine discomfort. He denies any loss of consciousness before or after his fall. Patient came from [facility].   Past Medical History  Diagnosis Date  . HTN (hypertension) 1992  . Renal insufficiency 04/1999    CDKIII-IV w CR ~ 2 per [facility]  . Diabetes mellitus, type 2 (HCC) 05/07/2002  . Gastroduodenitis 9/18.2000    EGD mild  . Intermittent vertigo 2011    better w physical therapy  . BPH (benign prostatic hyperplasia)     followed by urology   . Hyperlipidemia   . Anemia     due to renal disease per Dr Park Breed with heme    Patient Active Problem List   Diagnosis Date Noted  . DIZZINESS 05/13/2010  . NAUSEA 05/06/2010  . OSTEOPENIA 06/17/2008  . VITAMIN D DEFICIENCY 12/11/2007  . DYSLIPIDEMIA 11/27/2007  . HEARING LOSS 11/27/2007  . CAD 11/27/2007  . GASTRITIS, ANTRAL 11/27/2007  . ANEMIA NOS 04/26/2007  . HYPERTENSION, BENIGN 04/19/2007  . HYPERTROPHY PROSTATE BNG W/URINARY OBST/LUTS 04/02/2007  . FATIGUE, ACUTE 04/02/2007  . ELEVATED C-REACTIVE PROTEIN 03/19/2007  . HYPERPARATHYROIDISM, SECONDARY 03/06/2007  . Nocturia 03/06/2007  . ALLERGY 03/06/2007  . DIABETES MELLITUS, TYPE II 05/07/2002  . HIATAL HERNIA 06/20/1999    Past Surgical History  Procedure Laterality Date  . Tonsillectomy  1949  . Nasal polyp surgery  1949  . Colonoscopy  06/20/1999    int hemms  . Esophagogastroduodenoscopy  06/20/1999     mild gastroduodenitis  . 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 Abd u/s  01/11/2010    nml    Past  Surgical History  Procedure Laterality Date  . Tonsillectomy  1949  . Nasal polyp surgery  1949  . Colonoscopy  06/20/1999    int hemms  . Esophagogastroduodenoscopy  06/20/1999     mild gastroduodenitis  . 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 Abd u/s  01/11/2010    nml    Current Outpatient Rx  Name  Route  Sig  Dispense  Refill  . ACCU-CHEK AVIVA test strip      TEST BLOOD SUGAR ONE TO TWO TIMES A WEEK   50 each   9   . alfuzosin (UROXATRAL) 10 MG 24 hr tablet   Oral   Take 1 tablet (10 mg total) by mouth daily.         Marland Kitchen aspirin (ASPIR-LOW) 81 MG EC tablet   Oral   Take 81 mg by mouth daily.           . cholecalciferol (VITAMIN D) 1000 UNITS tablet   Oral   Take 1,000 Units by mouth daily.           . ferrous sulfate (FERRO-BOB) 325 (65 FE) MG tablet   Oral   Take 325 mg by mouth daily.           Marland Kitchen lisinopril (PRINIVIL,ZESTRIL) 20 MG tablet  TAKE ONE TABLET BY MOUTH ONCE DAILY   90 tablet   1   . verapamil (CALAN-SR) 240 MG CR tablet      TAKE ONE TABLET BY MOUTH EVERY DAY   30 tablet   0     Patient must make appointment   . verapamil (VERELAN PM) 240 MG 24 hr capsule   Oral   Take 1 capsule (240 mg total) by mouth daily.   30 capsule   5     Allergies:  Doxazosin mesylate; Hydrochlorothiazide w-triamterene; Penicillins; and Simvastatin  Family History: Family History  Problem Relation Age of Onset  . Stroke Father   . Stroke Sister   . Stroke Sister   . Pneumonia Sister     after fx hip    Social History: Social History  Substance Use Topics  . Smoking status: Never Smoker   . Smokeless tobacco: None     Comment: Remote tobacco   . Alcohol Use: No     Review of Systems:   10 point review of systems was performed and was otherwise negative:  Constitutional: No fever Eyes: No visual disturbances ENT: No sore throat, ear pain Cardiac: No chest pain Respiratory: No shortness of  breath, wheezing, or stridor Abdomen: No abdominal pain, no vomiting, No diarrhea Endocrine: No weight loss, No night sweats Extremities: No peripheral edema, cyanosis Skin: No rashes, easy bruising Neurologic: No focal weakness, trouble with speech or swollowing Urologic: No dysuria, Hematuria, or urinary frequency   Physical Exam:  ED Triage Vitals  Enc Vitals Group     BP 12/07/15 1612 164/74 mmHg     Pulse Rate 12/07/15 1612 81     Resp 12/07/15 1612 15     Temp --      Temp Source 12/07/15 1612 Oral     SpO2 12/07/15 1612 97 %     Weight --      Height --      Head Cir --      Peak Flow --      Pain Score 12/07/15 1604 0     Pain Loc --      Pain Edu? --      Excl. in GC? --     General: Awake , Alert , and Oriented times 3; GCS 15 Head: The patient presents with a hematoma center of the forehead without any step-off or crepitus. Small 1 cm superficial laceration with a small amount of active bleeding. Eyes: Pupils equal , round, reactive to light Nose/Throat: No nasal drainage, patent upper airway without erythema or exudate.  Neck: Supple, Full range of motion, No anterior adenopathy or palpable thyroid masses Lungs: Clear to ascultation without wheezes , rhonchi, or rales Heart: Regular rate, regular rhythm without murmurs , gallops , or rubs Abdomen: Soft, non tender without rebound, guarding , or rigidity; bowel sounds positive and symmetric in all 4 quadrants. No organomegaly .        Extremities: 2 plus symmetric pulses. No edema, clubbing or cyanosis Neurologic: normal ambulation, Motor symmetric without deficits, sensory intact Skin: warm, dry, no rashes    Radiology:  EXAM: CT HEAD WITHOUT CONTRAST  TECHNIQUE: Contiguous axial images were obtained from the base of the skull through the vertex without intravenous contrast.  COMPARISON: 10/19/2014  FINDINGS: Right forehead hematoma is noted. No underlying bony abnormality is seen. Some fluid is  noted within the mastoid air cells on the left. Generalized atrophy is noted. Bilateral MCA calcifications are noted.  Scattered areas of chronic white matter ischemic change is seen. No findings to suggest acute hemorrhage, acute infarction or space-occupying mass lesion are noted. 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  IMPRESSION: Chronic atrophic and ischemic changes. No acute intracranial abnormality is noted.  Right frontal forehead hematoma   Electronically Signed       I personally reviewed the radiologic studies     ED Course: * Patient's stay here was uneventful and his head CT showed no evidence of subdural hematoma, epidural hematoma, cerebral contusion, or any other serious acute closed head injury. Patient had no loss of consciousness and I felt required no syncope workup. He otherwise does not appear to have any other injuries outside of a frontal hematoma. He has a very superficial skin laceration over top of the hematoma which was Steri-Stripped by the nursing staff and did not require suture repair. Patient will be discharged back to his living area Assessment:  Acute closed head injury    Plan:  Outpatient management Patient was advised to return immediately if condition worsens. Patient was advised to follow up with their primary care physician or other specialized physicians involved in their outpatient care             Jennye MoccasinBrian S Abdoulaye Drum, MD 12/07/15 [phone]

## 2015-12-07 NOTE — ED Notes (Signed)
[facility] for mechanical fall out of wheel chair. Pt has hematoma above RT eye. Pt not on anticoags per EMS. Pt alert, hard of hearing. No other known injuries

## 2015-12-07 NOTE — ED Notes (Signed)
Steri strip applied to hematoma.

## 2015-12-07 NOTE — ED Notes (Signed)
Pt assisted with urinal

## 2015-12-08 ENCOUNTER — Inpatient Hospital Stay (HOSPITAL_COMMUNITY)
Admit: 2015-12-08 | Discharge: 2015-12-08 | Disposition: A | Discharge status: Home / Self Care | Payer: Medicare Other | Attending: Internal Medicine | Admitting: Internal Medicine

## 2015-12-08 ENCOUNTER — Inpatient Hospital Stay
Admission: EM | Admit: 2015-12-08 | Discharge: 2015-12-12 | DRG: 193 | Disposition: A | Discharge status: Skilled Nursing Facility | Payer: Medicare Other | Attending: Internal Medicine | Admitting: Internal Medicine

## 2015-12-08 ENCOUNTER — Emergency Department: Payer: Medicare Other

## 2015-12-08 DIAGNOSIS — Z66 Do not resuscitate: Secondary | ICD-10-CM | POA: Diagnosis present

## 2015-12-08 DIAGNOSIS — Z7982 Long term (current) use of aspirin: Secondary | ICD-10-CM | POA: Diagnosis not present

## 2015-12-08 DIAGNOSIS — Z79899 Other long term (current) drug therapy: Secondary | ICD-10-CM | POA: Diagnosis not present

## 2015-12-08 DIAGNOSIS — R7989 Other specified abnormal findings of blood chemistry: Secondary | ICD-10-CM

## 2015-12-08 DIAGNOSIS — I248 Other forms of acute ischemic heart disease: Secondary | ICD-10-CM | POA: Diagnosis present

## 2015-12-08 DIAGNOSIS — R0600 Dyspnea, unspecified: Secondary | ICD-10-CM

## 2015-12-08 DIAGNOSIS — E1122 Type 2 diabetes mellitus with diabetic chronic kidney disease: Secondary | ICD-10-CM | POA: Diagnosis present

## 2015-12-08 DIAGNOSIS — D72819 Decreased white blood cell count, unspecified: Secondary | ICD-10-CM | POA: Diagnosis present

## 2015-12-08 DIAGNOSIS — R609 Edema, unspecified: Secondary | ICD-10-CM

## 2015-12-08 DIAGNOSIS — Y95 Nosocomial condition: Secondary | ICD-10-CM | POA: Diagnosis present

## 2015-12-08 DIAGNOSIS — S0083XA Contusion of other part of head, initial encounter: Secondary | ICD-10-CM | POA: Diagnosis present

## 2015-12-08 DIAGNOSIS — I509 Heart failure, unspecified: Secondary | ICD-10-CM | POA: Diagnosis present

## 2015-12-08 DIAGNOSIS — D631 Anemia in chronic kidney disease: Secondary | ICD-10-CM | POA: Diagnosis present

## 2015-12-08 DIAGNOSIS — D696 Thrombocytopenia, unspecified: Secondary | ICD-10-CM | POA: Diagnosis present

## 2015-12-08 DIAGNOSIS — E785 Hyperlipidemia, unspecified: Secondary | ICD-10-CM | POA: Diagnosis present

## 2015-12-08 DIAGNOSIS — G9341 Metabolic encephalopathy: Secondary | ICD-10-CM | POA: Diagnosis present

## 2015-12-08 DIAGNOSIS — I130 Hypertensive heart and chronic kidney disease with heart failure and stage 1 through stage 4 chronic kidney disease, or unspecified chronic kidney disease: Secondary | ICD-10-CM | POA: Diagnosis present

## 2015-12-08 DIAGNOSIS — E871 Hypo-osmolality and hyponatremia: Secondary | ICD-10-CM | POA: Diagnosis present

## 2015-12-08 DIAGNOSIS — S8002XA Contusion of left knee, initial encounter: Secondary | ICD-10-CM | POA: Diagnosis present

## 2015-12-08 DIAGNOSIS — N400 Enlarged prostate without lower urinary tract symptoms: Secondary | ICD-10-CM | POA: Diagnosis present

## 2015-12-08 DIAGNOSIS — N183 Chronic kidney disease, stage 3 (moderate): Secondary | ICD-10-CM | POA: Diagnosis present

## 2015-12-08 DIAGNOSIS — Y92129 Unspecified place in nursing home as the place of occurrence of the external cause: Secondary | ICD-10-CM | POA: Diagnosis not present

## 2015-12-08 DIAGNOSIS — K2990 Gastroduodenitis, unspecified, without bleeding: Secondary | ICD-10-CM | POA: Diagnosis present

## 2015-12-08 DIAGNOSIS — J189 Pneumonia, unspecified organism: Secondary | ICD-10-CM | POA: Diagnosis present

## 2015-12-08 DIAGNOSIS — Z823 Family history of stroke: Secondary | ICD-10-CM

## 2015-12-08 DIAGNOSIS — H9190 Unspecified hearing loss, unspecified ear: Secondary | ICD-10-CM | POA: Diagnosis present

## 2015-12-08 DIAGNOSIS — R4182 Altered mental status, unspecified: Secondary | ICD-10-CM

## 2015-12-08 DIAGNOSIS — W050XXA Fall from non-moving wheelchair, initial encounter: Secondary | ICD-10-CM | POA: Diagnosis present

## 2015-12-08 DIAGNOSIS — I214 Non-ST elevation (NSTEMI) myocardial infarction: Secondary | ICD-10-CM | POA: Diagnosis present

## 2015-12-08 DIAGNOSIS — W19XXXA Unspecified fall, initial encounter: Secondary | ICD-10-CM | POA: Diagnosis present

## 2015-12-08 DIAGNOSIS — J9811 Atelectasis: Secondary | ICD-10-CM | POA: Diagnosis present

## 2015-12-08 DIAGNOSIS — R778 Other specified abnormalities of plasma proteins: Secondary | ICD-10-CM

## 2015-12-08 LAB — CBC
HCT: 34.8 % — ABNORMAL LOW (ref 40.0–52.0)
HEMOGLOBIN: 12.1 g/dL — AB (ref 13.0–18.0)
MCH: 32.9 pg (ref 26.0–34.0)
MCHC: 34.8 g/dL (ref 32.0–36.0)
MCV: 94.5 fL (ref 80.0–100.0)
PLATELETS: 95 10*3/uL — AB (ref 150–440)
RBC: 3.68 MIL/uL — ABNORMAL LOW (ref 4.40–5.90)
RDW: 12.7 % (ref 11.5–14.5)
WBC: 6.2 10*3/uL (ref 3.8–10.6)

## 2015-12-08 LAB — COMPREHENSIVE METABOLIC PANEL
ALBUMIN: 3.8 g/dL (ref 3.5–5.0)
ALT: 16 U/L — ABNORMAL LOW (ref 17–63)
ANION GAP: 6 (ref 5–15)
AST: 30 U/L (ref 15–41)
Alkaline Phosphatase: 62 U/L (ref 38–126)
BUN: 34 mg/dL — ABNORMAL HIGH (ref 6–20)
CHLORIDE: 102 mmol/L (ref 101–111)
CO2: 24 mmol/L (ref 22–32)
Calcium: 10.1 mg/dL (ref 8.9–10.3)
Creatinine, Ser: 1.89 mg/dL — ABNORMAL HIGH (ref 0.61–1.24)
GFR calc Af Amer: 34 mL/min — ABNORMAL LOW (ref >60)
GFR calc non Af Amer: 30 mL/min — ABNORMAL LOW (ref >60)
GLUCOSE: 116 mg/dL — AB (ref 65–99)
POTASSIUM: 4.8 mmol/L (ref 3.5–5.1)
SODIUM: 132 mmol/L — AB (ref 135–145)
Total Bilirubin: 0.5 mg/dL (ref 0.3–1.2)
Total Protein: 6.3 g/dL — ABNORMAL LOW (ref 6.5–8.1)

## 2015-12-08 LAB — URINALYSIS COMPLETE WITH MICROSCOPIC (ARMC ONLY)
BACTERIA UA: NONE SEEN
Bilirubin Urine: NEGATIVE
Glucose, UA: NEGATIVE mg/dL
Hgb urine dipstick: NEGATIVE
Ketones, ur: NEGATIVE mg/dL
Leukocytes, UA: NEGATIVE
Nitrite: NEGATIVE
PH: 6 (ref 5.0–8.0)
PROTEIN: 100 mg/dL — AB
SPECIFIC GRAVITY, URINE: 1.017 (ref 1.005–1.030)

## 2015-12-08 LAB — TROPONIN I
TROPONIN I: 0.29 ng/mL — AB (ref <0.031)
Troponin I: 0.23 ng/mL — ABNORMAL HIGH (ref <0.031)
Troponin I: 0.25 ng/mL — ABNORMAL HIGH (ref <0.031)

## 2015-12-08 LAB — BLOOD GAS, VENOUS
ACID-BASE DEFICIT: 0.5 mmol/L (ref 0.0–2.0)
Bicarbonate: 24.2 mEq/L (ref 21.0–28.0)
PATIENT TEMPERATURE: 37
PCO2 VEN: 39 mmHg — AB (ref 44.0–60.0)
PH VEN: 7.4 (ref 7.320–7.430)

## 2015-12-08 LAB — ECHOCARDIOGRAM COMPLETE: Height: 71 in

## 2015-12-08 LAB — GLUCOSE, CAPILLARY: GLUCOSE-CAPILLARY: 100 mg/dL — AB (ref 65–99)

## 2015-12-08 MED ORDER — GUAIFENESIN 100 MG/5ML PO SOLN: 100.0000 mg | ORAL | Status: DC | PRN

## 2015-12-08 MED ORDER — SODIUM CHLORIDE 0.9 % IV SOLN
INTRAVENOUS | Status: DC
  Administered 2015-12-08 – 2015-12-10 (×4): via INTRAVENOUS

## 2015-12-08 MED ORDER — LEVOFLOXACIN IN D5W 750 MG/150ML IV SOLN
750.0000 mg | INTRAVENOUS | Status: DC
  Administered 2015-12-10: 750 mg via INTRAVENOUS
  Filled 2015-12-08: qty 150

## 2015-12-08 MED ORDER — VITAMIN D 1000 UNITS PO TABS
2000.0000 [IU] | ORAL_TABLET | Freq: Every day | ORAL | Status: DC
  Administered 2015-12-08 – 2015-12-12 (×5): 2000 [IU] via ORAL
  Filled 2015-12-08 (×6): qty 2

## 2015-12-08 MED ORDER — SUCRALFATE 1 G PO TABS
1.0000 g | ORAL_TABLET | Freq: Three times a day (TID) | ORAL | Status: DC
  Administered 2015-12-08 – 2015-12-10 (×9): 1 g via ORAL
  Filled 2015-12-08 (×9): qty 1

## 2015-12-08 MED ORDER — SODIUM CHLORIDE 0.9% FLUSH
3.0000 mL | Freq: Two times a day (BID) | INTRAVENOUS | Status: DC
  Administered 2015-12-08 – 2015-12-12 (×7): 3 mL via INTRAVENOUS

## 2015-12-08 MED ORDER — CLONIDINE HCL 0.1 MG PO TABS: 0.1000 mg | ORAL_TABLET | Freq: Two times a day (BID) | ORAL | Status: DC | PRN

## 2015-12-08 MED ORDER — ACETAMINOPHEN 325 MG PO TABS: 650.0000 mg | ORAL_TABLET | Freq: Four times a day (QID) | ORAL | Status: DC | PRN

## 2015-12-08 MED ORDER — SODIUM CHLORIDE 0.9% FLUSH: 3.0000 mL | INTRAVENOUS | Status: DC | PRN

## 2015-12-08 MED ORDER — SODIUM CHLORIDE 0.9 % IV SOLN: 250.0000 mL | INTRAVENOUS | Status: DC | PRN

## 2015-12-08 MED ORDER — ACETAMINOPHEN 325 MG PO TABS: 650.0000 mg | ORAL_TABLET | ORAL | Status: DC | PRN

## 2015-12-08 MED ORDER — DIVALPROEX SODIUM 125 MG PO CSDR
125.0000 mg | DELAYED_RELEASE_CAPSULE | Freq: Every morning | ORAL | Status: DC
  Administered 2015-12-08 – 2015-12-12 (×5): 125 mg via ORAL
  Filled 2015-12-08 (×5): qty 1

## 2015-12-08 MED ORDER — ASPIRIN 81 MG PO CHEW
324.0000 mg | CHEWABLE_TABLET | Freq: Once | ORAL | Status: AC
  Administered 2015-12-08: 324 mg via ORAL
  Filled 2015-12-08: qty 4

## 2015-12-08 MED ORDER — ASPIRIN EC 81 MG PO TBEC
81.0000 mg | DELAYED_RELEASE_TABLET | Freq: Every day | ORAL | Status: DC
  Administered 2015-12-09 – 2015-12-12 (×4): 81 mg via ORAL
  Filled 2015-12-08 (×4): qty 1

## 2015-12-08 MED ORDER — ASPIRIN 81 MG PO CHEW
324.0000 mg | CHEWABLE_TABLET | ORAL | Status: DC
  Filled 2015-12-08: qty 4

## 2015-12-08 MED ORDER — HYDROCODONE-ACETAMINOPHEN 5-325 MG PO TABS
1.0000 | ORAL_TABLET | ORAL | Status: DC | PRN
  Administered 2015-12-08 – 2015-12-11 (×4): 1 via ORAL
  Filled 2015-12-08 (×5): qty 1

## 2015-12-08 MED ORDER — ALUM & MAG HYDROXIDE-SIMETH 200-200-20 MG/5ML PO SUSP: 30.0000 mL | ORAL | Status: DC | PRN

## 2015-12-08 MED ORDER — LISINOPRIL 10 MG PO TABS
10.0000 mg | ORAL_TABLET | Freq: Every day | ORAL | Status: DC
  Administered 2015-12-08 – 2015-12-12 (×5): 10 mg via ORAL
  Filled 2015-12-08 (×5): qty 1

## 2015-12-08 MED ORDER — ENOXAPARIN SODIUM 30 MG/0.3ML ~~LOC~~ SOLN
30.0000 mg | SUBCUTANEOUS | Status: DC
  Administered 2015-12-09 – 2015-12-11 (×3): 30 mg via SUBCUTANEOUS
  Filled 2015-12-08 (×3): qty 0.3

## 2015-12-08 MED ORDER — ENOXAPARIN SODIUM 80 MG/0.8ML ~~LOC~~ SOLN
1.0000 mg/kg | SUBCUTANEOUS | Status: DC
  Administered 2015-12-08: 75 mg via SUBCUTANEOUS
  Filled 2015-12-08: qty 0.8

## 2015-12-08 MED ORDER — NITROGLYCERIN 0.4 MG SL SUBL: 0.4000 mg | SUBLINGUAL_TABLET | SUBLINGUAL | Status: DC | PRN

## 2015-12-08 MED ORDER — LEVOFLOXACIN IN D5W 750 MG/150ML IV SOLN
750.0000 mg | INTRAVENOUS | Status: DC
  Administered 2015-12-08: 750 mg via INTRAVENOUS
  Filled 2015-12-08: qty 150

## 2015-12-08 MED ORDER — DIVALPROEX SODIUM 125 MG PO CSDR
250.0000 mg | DELAYED_RELEASE_CAPSULE | Freq: Every day | ORAL | Status: DC
  Administered 2015-12-08 – 2015-12-11 (×4): 250 mg via ORAL
  Filled 2015-12-08 (×5): qty 2

## 2015-12-08 MED ORDER — SIMETHICONE 80 MG PO CHEW
80.0000 mg | CHEWABLE_TABLET | Freq: Four times a day (QID) | ORAL | Status: DC | PRN
  Filled 2015-12-08: qty 1

## 2015-12-08 MED ORDER — RISAQUAD PO CAPS
1.0000 | ORAL_CAPSULE | Freq: Every day | ORAL | Status: DC
  Administered 2015-12-08 – 2015-12-12 (×5): 1 via ORAL
  Filled 2015-12-08 (×5): qty 1

## 2015-12-08 MED ORDER — ASPIRIN EC 81 MG PO TBEC: 81.0000 mg | DELAYED_RELEASE_TABLET | Freq: Every day | ORAL | Status: DC

## 2015-12-08 MED ORDER — ONDANSETRON HCL 4 MG/2ML IJ SOLN: 4.0000 mg | Freq: Four times a day (QID) | INTRAMUSCULAR | Status: DC | PRN

## 2015-12-08 MED ORDER — SENNOSIDES-DOCUSATE SODIUM 8.6-50 MG PO TABS
1.0000 | ORAL_TABLET | Freq: Every day | ORAL | Status: DC
  Administered 2015-12-08 – 2015-12-11 (×4): 1 via ORAL
  Filled 2015-12-08 (×4): qty 1

## 2015-12-08 MED ORDER — MAGNESIUM HYDROXIDE 400 MG/5ML PO SUSP: 30.0000 mL | Freq: Every day | ORAL | Status: DC | PRN

## 2015-12-08 MED ORDER — ASPIRIN 300 MG RE SUPP: 300.0000 mg | RECTAL | Status: DC

## 2015-12-08 MED ORDER — ADULT MULTIVITAMIN W/MINERALS CH
1.0000 | ORAL_TABLET | Freq: Every day | ORAL | Status: DC
  Administered 2015-12-08 – 2015-12-10 (×3): 1 via ORAL
  Filled 2015-12-08 (×3): qty 1

## 2015-12-08 MED ORDER — PANTOPRAZOLE SODIUM 40 MG PO TBEC
40.0000 mg | DELAYED_RELEASE_TABLET | Freq: Every day | ORAL | Status: DC
  Administered 2015-12-08 – 2015-12-12 (×5): 40 mg via ORAL
  Filled 2015-12-08 (×5): qty 1

## 2015-12-08 NOTE — Progress Notes (Signed)
[facility] [place] [facility]   PATIENT NAME: Timothy Ramos    MR#:  [id number]  DATE OF BIRTH:  12/29/1922  SUBJECTIVE:  Patient presented with a fall and confusion  REVIEW OF SYSTEMS:    Review of Systems  Constitutional: Negative for fever, chills and malaise/fatigue.  HENT: Negative for sore throat.   Eyes: Negative for blurred vision.  Respiratory: Positive for cough. Negative for hemoptysis, shortness of breath and wheezing.   Cardiovascular: Negative for chest pain, palpitations and leg swelling.  Gastrointestinal: Negative for nausea, vomiting, abdominal pain, diarrhea and blood in stool.  Genitourinary: Negative for dysuria.  Musculoskeletal: Negative for back pain.  Neurological: Negative for dizziness, tremors and headaches.  Endo/Heme/Allergies: Does not bruise/bleed easily.    Tolerating Diet:yes      DRUG ALLERGIES:   Allergies  Allergen Reactions  . Doxazosin Mesylate Other (See Comments)    Reaction:  Unknown   . Hydrochlorothiazide W-Triamterene Other (See Comments)    Reaction:  Unknown   . Penicillins Other (See Comments)    Reaction:  Unknown   . Simvastatin Other (See Comments)    Reaction:  Unknown     VITALS:  Blood pressure 118/55, pulse 81, temperature 98.2 F (36.8 C), temperature source Oral, resp. rate 17, height 5\' 11"  (1.803 m), weight 81.149 kg (178 lb 14.4 oz), SpO2 91 %.  PHYSICAL EXAMINATION:   Physical Exam  Constitutional: He is oriented to person, place, and time and well-developed, well-nourished, and in no distress. No distress.  HENT:  Head: Normocephalic.  Eyes: No scleral icterus.  Neck: Normal range of motion. Neck supple. No JVD present. No tracheal deviation present.  Cardiovascular: Normal rate, regular rhythm and normal heart sounds.  Exam reveals no gallop and no friction rub.   No murmur heard. Pulmonary/Chest: Effort normal and breath sounds normal. No respiratory distress. He  has no wheezes. He has no rales. He exhibits no tenderness.  Abdominal: Soft. Bowel sounds are normal. He exhibits no distension and no mass. There is no tenderness. There is no rebound and no guarding.  Musculoskeletal: Normal range of motion. He exhibits no edema.  Neurological: He is alert and oriented to person, place, and time.  Skin: Skin is warm. No rash noted. No erythema.  Bruise on forehead  Psychiatric: Affect and judgment normal.      LABORATORY PANEL:   CBC  Recent Labs Lab 12/08/15 0453  WBC 6.2  HGB 12.1*  HCT 34.8*  PLT 95*   ------------------------------------------------------------------------------------------------------------------  Chemistries   Recent Labs Lab 12/08/15 0453  NA 132*  K 4.8  CL 102  CO2 24  GLUCOSE 116*  BUN 34*  CREATININE 1.89*  CALCIUM 10.1  AST 30  ALT 16*  ALKPHOS 62  BILITOT 0.5   ------------------------------------------------------------------------------------------------------------------  Cardiac Enzymes  Recent Labs Lab 12/08/15 0453 12/08/15 0821  TROPONINI 0.23* 0.25*   ------------------------------------------------------------------------------------------------------------------  RADIOLOGY:  Ct Head Wo Contrast  12/07/2015  CLINICAL DATA:  Fall from wheelchair with head injury, initial encounter EXAM: CT HEAD WITHOUT CONTRAST TECHNIQUE: Contiguous axial images were obtained from the base of the skull through the vertex without intravenous contrast. COMPARISON:  10/19/2014 FINDINGS: Right forehead hematoma is noted. No underlying bony abnormality is seen. Some fluid is noted within the mastoid air cells on the left. Generalized atrophy is noted. Bilateral MCA calcifications are noted. Scattered areas of chronic white matter ischemic change is seen. No findings to suggest acute hemorrhage, acute infarction or space-occupying  mass lesion are noted. 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  IMPRESSION: Chronic atrophic and ischemic changes. No acute intracranial abnormality is noted. Right frontal forehead hematoma Electronically Signed   By: Timothy Ramos M.D.   On: 12/07/2015 16:32   Dg Chest Portable 1 View  12/08/2015  CLINICAL DATA:  Hypoxia. Cough. Patient has been here 2 times previously. EXAM: PORTABLE CHEST 1 VIEW COMPARISON:  09/16/2012 FINDINGS: Shallow inspiration. Atelectasis or infiltration in the right lung base. Mild cardiac enlargement. Pulmonary vascularity is normal. Calcified and tortuous aorta. No blunting of costophrenic angles. No pneumothorax. IMPRESSION: Infiltration or atelectasis in the right lung base. Electronically Signed   By: Timothy Ramos M.D.   On: 12/08/2015 04:48     ASSESSMENT AND PLAN:   82 year old male with a history of essential hypertension and chronic kidney disease stage III presented with fall and confusion.  1. Metabolic encephalopathy: This is due to right lower lung pneumonia. Patient's confusion is improving.  2.HCAP: Continue Levaquin The cultures are negative to date. 3. Elevated troponin: This is due to demand ischemia and not ACS. Cardiology consult pending. 4. Type 2 diabetes without complication: Continue sliding scale insulin  5. Essential hypertension: Continue clonidine and lisinopril.  6. Gastroduodenitis: Continue Carafate and PPI.  7. Chronic kidney disease stage III: Creatinine remained stable. Hold nephrotoxic agents and continue to monitor.  Management plans discussed with the patient and daughterand he is in agreement.  CODE STATUS: DNR  TOTAL TIME TAKING CARE OF THIS PATIENT: 30 minutes.     POSSIBLE D/C 1-2 days, DEPENDING ON CLINICAL CONDITION.   Timothy Ramos M.D on 12/08/2015 at 11:36 AM  Between 7am to 6pm - Pager - [phone] After 6pm go to [email or web address] - password [email or web address] [facility]  [place] [facility]  Office  [phone]  CC: Primary care physician; Timothy Givens, MD  Note: This  dictation was prepared with Dragon dictation along with smaller phrase technology. Any transcriptional errors that result from this process are unintentional.

## 2015-12-08 NOTE — ED Notes (Signed)
EDP notified. 

## 2015-12-08 NOTE — ED Provider Notes (Addendum)
[facility] Emergency Department Provider Note  ____________________________________________  Time seen: Approximately 144 AM  I have reviewed the triage vital signs and the nursing notes.   HISTORY  Chief Complaint Fall  History obtained from nursing home staff, patient confused and not answering questions insistently.  HPI Timothy Ramos is a 82 y.o. male who was brought in by ambulance with some altered mental status. The patient has been seen here in the emergency department 2 times previously today. According to the staff he could not get out of bed which he is usually able to do independently. He lives in independent living and the staff reported he was very agitated and shaking. They report that he was unable to sit up and had oxygen saturation in the 70s and 80s.The staff at the nursing home is unsure if he's had a cough or cold. She reports that he had a temperature of 100.1 but it was with a temporal thermometer. She also reports that he was incontinent of urine and stool which is not normal for him. She reports that his symptoms have gotten worse since he returned from his last ER visit. He does have a history of dementia but she reports it is not that bad. The staff at the nursing home was concerned so they sent the patient back. He was not having any specific complaints tonight.   Past Medical History  Diagnosis Date  . HTN (hypertension) 1992  . Renal insufficiency 04/1999    CDKIII-IV w CR ~ 2 per [facility]  . Diabetes mellitus, type 2 (HCC) 05/07/2002  . Gastroduodenitis 9/18.2000    EGD mild  . Intermittent vertigo 2011    better w physical therapy  . BPH (benign prostatic hyperplasia)     followed by urology   . Hyperlipidemia   . Anemia     due to renal disease per Dr Park Breed with heme    Patient Active Problem List   Diagnosis Date Noted  . DIZZINESS 05/13/2010  . NAUSEA 05/06/2010  . OSTEOPENIA 06/17/2008  . VITAMIN D DEFICIENCY 12/11/2007   . DYSLIPIDEMIA 11/27/2007  . HEARING LOSS 11/27/2007  . CAD 11/27/2007  . GASTRITIS, ANTRAL 11/27/2007  . ANEMIA NOS 04/26/2007  . HYPERTENSION, BENIGN 04/19/2007  . HYPERTROPHY PROSTATE BNG W/URINARY OBST/LUTS 04/02/2007  . FATIGUE, ACUTE 04/02/2007  . ELEVATED C-REACTIVE PROTEIN 03/19/2007  . HYPERPARATHYROIDISM, SECONDARY 03/06/2007  . Nocturia 03/06/2007  . ALLERGY 03/06/2007  . DIABETES MELLITUS, TYPE II 05/07/2002  . HIATAL HERNIA 06/20/1999    Past Surgical History  Procedure Laterality Date  . Tonsillectomy  1949  . Nasal polyp surgery  1949  . Colonoscopy  06/20/1999    int hemms  . Esophagogastroduodenoscopy  06/20/1999     mild gastroduodenitis  . 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 Abd u/s  01/11/2010    nml    Current Outpatient Rx  Name  Route  Sig  Dispense  Refill  . acetaminophen (TYLENOL) 325 MG tablet   Oral   Take 650 mg by mouth every 6 (six) hours as needed for mild pain, fever or headache.         Marland Kitchen acidophilus (RISAQUAD) CAPS capsule   Oral   Take 1 capsule by mouth daily.         Marland Kitchen alum & mag hydroxide-simeth (MAALOX PLUS) 400-400-40 MG/5ML suspension   Oral   Take 30 mLs by mouth every 4 (  four) hours as needed for indigestion.         Marland Kitchen aspirin EC 81 MG tablet   Oral   Take 81 mg by mouth daily.         . carbamide peroxide (DEBROX) 6.5 % otic solution   Both Ears   Place 5 drops into both ears 2 (two) times daily as needed (for ear itching/ear wax).         . cholecalciferol (VITAMIN D) 1000 UNITS tablet   Oral   Take 2,000 Units by mouth daily.          . cloNIDine (CATAPRES) 0.1 MG tablet   Oral   Take 0.1 mg by mouth every 12 (twelve) hours as needed (for elevated BP).         Marland Kitchen divalproex (DEPAKOTE SPRINKLE) 125 MG capsule   Oral   Take 125-250 mg by mouth 2 (two) times daily. Pt takes one capsule in the morning and two at bedtime.         Marland Kitchen esomeprazole (NEXIUM) 40  MG capsule   Oral   Take 40 mg by mouth 2 (two) times daily before a meal.         . guaifenesin (ROBITUSSIN) 100 MG/5ML syrup   Oral   Take 100 mg by mouth every 4 (four) hours as needed for cough.         Marland Kitchen HYDROcodone-acetaminophen (NORCO/VICODIN) 5-325 MG tablet   Oral   Take 1 tablet by mouth every 4 (four) hours as needed for moderate pain.         Marland Kitchen lisinopril (PRINIVIL,ZESTRIL) 10 MG tablet   Oral   Take 10 mg by mouth daily.         . magnesium hydroxide (MILK OF MAGNESIA) 400 MG/5ML suspension   Oral   Take 30 mLs by mouth daily as needed for mild constipation.         . Multiple Vitamin (MULTIVITAMIN WITH MINERALS) TABS tablet   Oral   Take 1 tablet by mouth daily.         . nitroGLYCERIN (NITROSTAT) 0.4 MG SL tablet   Sublingual   Place 0.4 mg under the tongue every 5 (five) minutes as needed for chest pain.         Marland Kitchen senna-docusate (SENOKOT-S) 8.6-50 MG tablet   Oral   Take 1 tablet by mouth at bedtime.         . simethicone (MYLICON) 80 MG chewable tablet   Oral   Chew 80 mg by mouth every 6 (six) hours as needed for flatulence.         . sucralfate (CARAFATE) 1 g tablet   Oral   Take 1 g by mouth 4 (four) times daily -  with meals and at bedtime.           Allergies Doxazosin mesylate; Hydrochlorothiazide w-triamterene; Penicillins; and Simvastatin  Family History  Problem Relation Age of Onset  . Stroke Father   . Stroke Sister   . Stroke Sister   . Pneumonia Sister     after fx hip    Social History Social History  Substance Use Topics  . Smoking status: Never Smoker   . Smokeless tobacco: Not on file     Comment: Remote tobacco   . Alcohol Use: No    Review of Systems  Unable to assess due to patient confusion ____________________________________________   PHYSICAL EXAM:  VITAL SIGNS: ED Triage Vitals  Enc Vitals Group  BP 12/08/15 0148 129/50 mmHg     Pulse Rate 12/08/15 0148 80     Resp 12/08/15  0148 16     Temp 12/08/15 0148 100.1 F (37.8 C)     Temp Source 12/08/15 0148 Oral     SpO2 12/08/15 0148 94 %     Weight --      Height --      Head Cir --      Peak Flow --      Pain Score --      Pain Loc --      Pain Edu? --      Excl. in GC? --     Constitutional: Sleepy but arousable. Well appearing and in no acute distress. Eyes: Conjunctivae are normal. PERRL. EOMI. Head: Forehead contusion with Steri-Strips Nose: No congestion/rhinnorhea. Mouth/Throat: Mucous membranes are moist.  Oropharynx non-erythematous. Cardiovascular: Normal rate, regular rhythm. Grossly normal heart sounds.  Good peripheral circulation. Respiratory: Normal respiratory effort.  No retractions. Lungs CTAB. Gastrointestinal: Soft and nontender. No distention. Positive bowel sounds Musculoskeletal: No lower extremity tenderness nor edema.   Neurologic:  Normal speech and language.  Skin:  Skin is warm, dry and intact.  Psychiatric: Mood and affect are normal.   ____________________________________________   LABS (all labs ordered are listed, but only abnormal results are displayed)  Labs Reviewed  CBC - Abnormal; Notable for the following:    RBC 3.68 (*)    Hemoglobin 12.1 (*)    HCT 34.8 (*)    Platelets 95 (*)    All other components within normal limits  COMPREHENSIVE METABOLIC PANEL - Abnormal; Notable for the following:    Sodium 132 (*)    Glucose, Bld 116 (*)    BUN 34 (*)    Creatinine, Ser 1.89 (*)    Total Protein 6.3 (*)    ALT 16 (*)    GFR calc non Af Amer 30 (*)    GFR calc Af Amer 34 (*)    All other components within normal limits  TROPONIN I - Abnormal; Notable for the following:    Troponin I 0.23 (*)    All other components within normal limits  BLOOD GAS, VENOUS - Abnormal; Notable for the following:    pCO2, Ven 39 (*)    pO2, Ven <31.0 (*)    All other components within normal limits  URINALYSIS COMPLETEWITH MICROSCOPIC ([facility] ONLY) - Abnormal; Notable for  the following:    Color, Urine YELLOW (*)    APPearance CLEAR (*)    Protein, ur 100 (*)    Squamous Epithelial / LPF 0-5 (*)    All other components within normal limits  CULTURE, BLOOD (ROUTINE X 2)  CULTURE, BLOOD (ROUTINE X 2)   ____________________________________________  EKG  ED ECG REPORT I, Rebecka ApleyWebster,  Gerold Sar P, the attending physician, personally viewed and interpreted this ECG.   Date: 12/08/2015  EKG Time: 544  Rate: 79  Rhythm: normal sinus rhythm  Axis: normal  Intervals:none  ST&T Change: normal  ____________________________________________  RADIOLOGY  CXR: Infiltration or atelectasis in the right lung base ____________________________________________   PROCEDURES  Procedure(s) performed: None  Critical Care performed: No  ____________________________________________   INITIAL IMPRESSION / ASSESSMENT AND PLAN / ED COURSE  Pertinent labs & imaging results that were available during my care of the patient were reviewed by me and considered in my medical decision making (see chart for details).  This is a 82 year old male who was sent in  by his nursing home with some altered mental status. They report the patient is not acting well. I performed a blood gas as well as a troponin to evaluate other causes of his altered mental status. It appears as though the patient has a troponin of 0.23. I will give the patient dose of aspirin. As the patient had a head injury today I will hold off on the blood thinner and defer the decision to the hospitalist. I will also give the patient some Levaquin as he has some infiltration and has had some hypoxia while here in the emergency department and at his nursing home. The patient will be admitted to the hospital for further evaluation of his symptoms. ____________________________________________   FINAL CLINICAL IMPRESSION(S) / ED DIAGNOSES  Final diagnoses:  Altered mental status, unspecified altered mental status type   Elevated troponin      Rebecka Apley, MD 12/08/15 4098  Rebecka Apley, MD 12/08/15 [phone]

## 2015-12-08 NOTE — ED Notes (Signed)
Pt repositioned to side for comfort.

## 2015-12-08 NOTE — ED Notes (Signed)
Spoke with Erie NoeVanessa at [facility] to notify that pt is to be admitted.  Phone number on file for emergency contact not valid.  [facility] to notify family.

## 2015-12-08 NOTE — Evaluation (Signed)
Physical Therapy Evaluation Patient Details Name: Timothy Ramos MRN: [id number] DOB: [birth year] Today's Date: 12/08/2015   History of Present Illness  Timothy Ramos is a 82 y.o. male with a known history of hypertension, chronic kidney disease stage III, type 2 diabetes mellitus, vertigo, prostate hypertrophy, hyperlipidemia presented to the emergency room after he had a fall in the nursing home. The facility patient had a fall and he was acting strange. He was not his usual baseline mental status. Patient on a regular basis is able to converse and move around in the facility. But today he was more confused and not talking to anyone. Patient made 3 visits to the emergency room today. Workup in the emergency room showed elevated troponin 0.23. Patient does not have any chest pain. No complaints of any shortness of breath. Patient has dementia and is a poor historian. Right frontal hematoma noted secondary to fall. Patient was worked up with a CT head which showed no acute intracranial abnormality. Hospitalist service was consulted for further care. During the stay in the ER patient also had fever of 100.50F, and his O2 sats were little low. Chest x-ray showed right lung infiltrate. Troponin has continued to trend upward and cardiology was consulted. Cardiology determined that elevated troponin was likely due to supply/demand ischemia. Pt is extremely hard of hearing and requires writing to communicate. Once questions are written pt is AOx4 and able to provide details regarding prior level of function. Pt has hearing aids at his facility. Pt reports that he ambulates limited distances with a rolling walker but also uses a wheelchair for mobility. Pt is complaining of bilateral leg soreness and pain with all mobility.   Clinical Impression  Pt is extremely hard of hearing despite attempts to yell in ear. He appears confused due to hearing difficulty however once written communication is utilized pt is AOx4. He  reports limited facility ambulation with rolling walker but also use of wheelchair. Pt is very weak today requiring modA+1 for bed mobility and maxA+1 for attempted sit to stand. Pt unable to come to standing due to LE weakness. He moans and groans during bed mobility due to severe LE pain. RN notified and present during bed mobility. Current function appears grossly deviated from what is understood to be patient's baseline. Pt would benefit from SNF placement in order to return to prior level of function at facility. Pt would benefit if family could bring hearing aids from facility to aid in communication with staff. Otherwise written communication would be the preferred means of communicating with patient. Pt will benefit from skilled PT services to address deficits in strength, balance, and mobility in order to return to full function at home.     Follow Up Recommendations SNF    Equipment Recommendations  None recommended by PT    Recommendations for Other Services       Precautions / Restrictions Precautions Precautions: Fall Restrictions Weight Bearing Restrictions: No      Mobility  Bed Mobility Overal bed mobility: Needs Assistance Bed Mobility: Supine to Sit;Sit to Supine     Supine to sit: Mod assist Sit to supine: Mod assist;+2 for physical assistance   General bed mobility comments: Pt is very weak with bed mobility. Pt groans and yells during bed mobility due to bilateral LE pain. RN notified  Transfers Overall transfer level: Needs assistance Equipment used: Rolling walker (2 wheeled) Transfers: Sit to/from Stand Sit to Stand: Max assist  General transfer comment: MaxA+1 for attempted sit to stand transfer with rolling walker. However pt unable to come to standing despite efforts by therapist. Pt with poor weight shifting and severe LE weakness. Appears to be deviated from baseline  Ambulation/Gait             General Gait Details: Unable to  ambulate at this time  Stairs            Wheelchair Mobility    Modified Rankin (Stroke Patients Only)       Balance Overall balance assessment: Needs assistance Sitting-balance support: No upper extremity supported Sitting balance-Leahy Scale: Fair       Standing balance-Leahy Scale: Zero                               Pertinent Vitals/Pain Pain Assessment: Faces Faces Pain Scale: Hurts whole lot Pain Location: bilateral LEs with bed mobility Pain Descriptors / Indicators: Sore Pain Intervention(s): Other (comment);Monitored during session;Limited activity within patient's tolerance (RN notified of bilateral LE pain)    Home Living Family/patient expects to be discharged to:: Unsure                 Additional Comments: Pt resides at [facility]    Prior Function Level of Independence: Needs assistance   Gait / Transfers Assistance Needed: Uses wheelchair for mobility but also uses a rolling walker for limited ambulation  ADL's / Homemaking Assistance Needed: Assist with bathing  Comments: Difficult to obtain history from patient as it all has to be written     Hand Dominance        Extremity/Trunk Assessment   Upper Extremity Assessment: Generalized weakness           Lower Extremity Assessment: Generalized weakness         Communication   Communication: HOH;Other (comment) (Severely HOH. Must write for clear understanding)  Cognition Arousal/Alertness: Awake/alert Behavior During Therapy: WFL for tasks assessed/performed Overall Cognitive Status: No family/caregiver present to determine baseline cognitive functioning                      General Comments      Exercises        Assessment/Plan    PT Assessment Patient needs continued PT services  PT Diagnosis Difficulty walking;Generalized weakness;Acute pain   PT Problem List Decreased strength;Decreased activity tolerance;Decreased  balance;Decreased mobility;Pain  PT Treatment Interventions DME instruction;Gait training;Functional mobility training;Therapeutic exercise;Therapeutic activities;Balance training;Neuromuscular re-education;Cognitive remediation;Patient/family education;Wheelchair mobility training   PT Goals (Current goals can be found in the Care Plan section) Acute Rehab PT Goals Patient Stated Goal: None provided by patient    Frequency Min 2X/week   Barriers to discharge        Co-evaluation               End of Session Equipment Utilized During Treatment: Gait belt Activity Tolerance: Patient limited by pain Patient left: in bed;with call bell/phone within reach;with bed alarm set Nurse Communication: Mobility status;Other (comment) (Bilateral LE pain)         Time: [id number] PT Time Calculation (min) (ACUTE ONLY): 25 min   Charges:   PT Evaluation $PT Eval Moderate Complexity: 1 Procedure     PT G Codes:       Sharalyn InkJason D Zak Gondek PT, DPT   Timothy Ramos 12/08/2015, 4:48 PM

## 2015-12-08 NOTE — ED Notes (Signed)
Dr Zenda AlpersWebster to call facility.  Pt has been here 2 other times today and has had full workup previously.

## 2015-12-08 NOTE — ED Notes (Signed)
Pt has been here 2 x previously.  Facility states pt was in the 70s on RA and had to be put on O2.  Facility states pt was agitated and that pt is normally A&Ox4 and independent.  Pt has documented history of dementia.  Pt relaxed and in NAD presently.  Pt is 93-95% on RA.  Pt HOH.

## 2015-12-08 NOTE — Progress Notes (Signed)
*  PRELIMINARY RESULTS* °Echocardiogram °2D Echocardiogram has been performed. ° °Timothy Ramos °12/08/2015, 2:33 PM °

## 2015-12-08 NOTE — Progress Notes (Signed)
Pharmacy Antibiotic Note  Timothy Ramos is a 82 y.o. male admitted on 12/08/2015 with pneumonia.  Pharmacy has been consulted for levofloxacin dosing.  Plan: Levofloxacin 750mg  IV q48h   Height: 5\' 11"  (180.3 cm) IBW/kg (Calculated) : 75.3  Temp (24hrs), Avg:99 F (37.2 C), Min:98.2 F (36.8 C), Max:100.1 F (37.8 C)   Recent Labs Lab 12/07/15 2020 12/08/15 0453  WBC 3.1* 6.2  CREATININE 1.82* 1.89*    Estimated Creatinine Clearance: 27.2 mL/min (by C-G formula based on Cr of 1.89).    Allergies  Allergen Reactions  . Doxazosin Mesylate Other (See Comments)    Reaction:  Unknown   . Hydrochlorothiazide W-Triamterene Other (See Comments)    Reaction:  Unknown   . Penicillins Other (See Comments)    Reaction:  Unknown   . Simvastatin Other (See Comments)    Reaction:  Unknown     Antimicrobials this admission: Levofloxacin  3/8 >>    Microbiology results: 3/8 BCx: sent   Thank you for allowing pharmacy to be a part of this patient's care.  Marty HeckWang, Jelani Trueba L 12/08/2015 8:19 AM

## 2015-12-08 NOTE — H&P (Addendum)
[facility] [place] [facility]   PATIENT NAME: Timothy Ramos    MR#:  [id number]  DATE OF BIRTH:  1923/02/16  DATE OF ADMISSION:  12/08/2015  PRIMARY CARE PHYSICIAN: Crawford Givens, MD   REQUESTING/REFERRING PHYSICIAN:   CHIEF COMPLAINT:   Chief Complaint  Patient presents with  . Fall    HISTORY OF PRESENT ILLNESS: Timothy Ramos  is a 82 y.o. male with a known history of hypertension, chronic kidney disease stage III type 2 diabetes mellitus, vertigo, prostate hypertrophy, hyperlipidemia presented to the emergency room after he had a fall in the nursing home. Her to the facility patient had a fall and he was acting strange. He was not his usual baseline mental status. Patient on a regular basis able to converse and move around in the facility. But today he was more confused and not talking to anyone. Patient made 3 visits to the emergency room today. Workup in the emergency room showed elevated troponin 0.23. Patient does not have any chest pain. No complaints of any shortness of breath. Patient has dementia and is a poor historian. Right frontal hematoma noted secondary to fall. Patient was worked up with a CT head which showed no acute intracranial abnormality. Hospitalist service was consulted for further care. During the stay in the ER patient also had fever of 100.70F, and his O2 sats were little low. Chest x-ray showed right lung infiltrate. Not much information could be obtained from the patient.  PAST MEDICAL HISTORY:   Past Medical History  Diagnosis Date  . HTN (hypertension) 1992  . Renal insufficiency 04/1999    CDKIII-IV w CR ~ 2 per [facility]  . Diabetes mellitus, type 2 (HCC) 05/07/2002  . Gastroduodenitis 9/18.2000    EGD mild  . Intermittent vertigo 2011    better w physical therapy  . BPH (benign prostatic hyperplasia)     followed by urology   . Hyperlipidemia   . Anemia     due to renal disease per Dr Park Breed with heme    PAST SURGICAL  HISTORY: Past Surgical History  Procedure Laterality Date  . Tonsillectomy  1949  . Nasal polyp surgery  1949  . Colonoscopy  06/20/1999    int hemms  . Esophagogastroduodenoscopy  06/20/1999     mild gastroduodenitis  . 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 Abd u/s  01/11/2010    nml    SOCIAL HISTORY:  Social History  Substance Use Topics  . Smoking status: Never Smoker   . Smokeless tobacco: Not on file     Comment: Remote tobacco   . Alcohol Use: No    FAMILY HISTORY:  Family History  Problem Relation Age of Onset  . Stroke Father   . Stroke Sister   . Stroke Sister   . Pneumonia Sister     after fx hip    DRUG ALLERGIES:  Allergies  Allergen Reactions  . Doxazosin Mesylate Other (See Comments)    Reaction:  Unknown   . Hydrochlorothiazide W-Triamterene Other (See Comments)    Reaction:  Unknown   . Penicillins Other (See Comments)    Reaction:  Unknown   . Simvastatin Other (See Comments)    Reaction:  Unknown     REVIEW OF SYSTEMS:  Unable to give history, has dementia.   MEDICATIONS AT HOME:  Prior to Admission medications   Medication Sig Start Date End Date Taking?  Authorizing Provider  acetaminophen (TYLENOL) 325 MG tablet Take 650 mg by mouth every 6 (six) hours as needed for mild pain, fever or headache.   Yes Historical Provider, MD  acidophilus (RISAQUAD) CAPS capsule Take 1 capsule by mouth daily.   Yes Historical Provider, MD  alum & mag hydroxide-simeth (MAALOX PLUS) 400-400-40 MG/5ML suspension Take 30 mLs by mouth every 4 (four) hours as needed for indigestion.   Yes Historical Provider, MD  aspirin EC 81 MG tablet Take 81 mg by mouth daily.   Yes Historical Provider, MD  carbamide peroxide (DEBROX) 6.5 % otic solution Place 5 drops into both ears 2 (two) times daily as needed (for ear itching/ear wax).   Yes Historical Provider, MD  cholecalciferol (VITAMIN D) 1000 UNITS tablet Take 2,000 Units by mouth  daily.    Yes Historical Provider, MD  cloNIDine (CATAPRES) 0.1 MG tablet Take 0.1 mg by mouth every 12 (twelve) hours as needed (for elevated BP).   Yes Historical Provider, MD  divalproex (DEPAKOTE SPRINKLE) 125 MG capsule Take 125-250 mg by mouth 2 (two) times daily. Pt takes one capsule in the morning and two at bedtime.   Yes Historical Provider, MD  esomeprazole (NEXIUM) 40 MG capsule Take 40 mg by mouth 2 (two) times daily before a meal.   Yes Historical Provider, MD  guaifenesin (ROBITUSSIN) 100 MG/5ML syrup Take 100 mg by mouth every 4 (four) hours as needed for cough.   Yes Historical Provider, MD  HYDROcodone-acetaminophen (NORCO/VICODIN) 5-325 MG tablet Take 1 tablet by mouth every 4 (four) hours as needed for moderate pain.   Yes Historical Provider, MD  lisinopril (PRINIVIL,ZESTRIL) 10 MG tablet Take 10 mg by mouth daily.   Yes Historical Provider, MD  magnesium hydroxide (MILK OF MAGNESIA) 400 MG/5ML suspension Take 30 mLs by mouth daily as needed for mild constipation.   Yes Historical Provider, MD  Multiple Vitamin (MULTIVITAMIN WITH MINERALS) TABS tablet Take 1 tablet by mouth daily.   Yes Historical Provider, MD  nitroGLYCERIN (NITROSTAT) 0.4 MG SL tablet Place 0.4 mg under the tongue every 5 (five) minutes as needed for chest pain.   Yes Historical Provider, MD  senna-docusate (SENOKOT-S) 8.6-50 MG tablet Take 1 tablet by mouth at bedtime.   Yes Historical Provider, MD  simethicone (MYLICON) 80 MG chewable tablet Chew 80 mg by mouth every 6 (six) hours as needed for flatulence.   Yes Historical Provider, MD  sucralfate (CARAFATE) 1 g tablet Take 1 g by mouth 4 (four) times daily -  with meals and at bedtime.   Yes Historical Provider, MD      PHYSICAL EXAMINATION:   VITAL SIGNS: Blood pressure 129/50, pulse 80, temperature 100.1 F (37.8 C), temperature source Oral, resp. rate 16, SpO2 94 %.  GENERAL:  82 y.o.-year-old patient lying in the bed with no acute distress.  EYES:  Pupils equal, round, reactive to light and accommodation. No scleral icterus. Extraocular muscles intact.  HEENT: Head : right frontal hematoma noted, normocephalic. Oropharynx dry and nasopharynx clear.  NECK:  Supple, no jugular venous distention. No thyroid enlargement, no tenderness.  LUNGS: Normal breath sounds bilaterally, no wheezing, rales,rhonchi or crepitation. No use of accessory muscles of respiration.  CARDIOVASCULAR: S1, S2 normal. No murmurs, rubs, or gallops.  ABDOMEN: Soft, nontender, nondistended. Bowel sounds present. No organomegaly or mass.  EXTREMITIES: No pedal edema, cyanosis, or clubbing.  NEUROLOGIC: Awake,alert not completely oriented.Moves all extremities. PSYCHIATRIC: could not be assessed SKIN: No obvious rash, lesion, or  ulcer.   LABORATORY PANEL:   CBC  Recent Labs Lab 12/07/15 2020 12/08/15 0453  WBC 3.1* 6.2  HGB 12.1* 12.1*  HCT 34.8* 34.8*  PLT 94* 95*  MCV 95.5 94.5  MCH 33.2 32.9  MCHC 34.8 34.8  RDW 12.7 12.7  LYMPHSABS 0.3*  --   MONOABS 0.4  --   EOSABS 0.0  --   BASOSABS 0.0  --    ------------------------------------------------------------------------------------------------------------------  Chemistries   Recent Labs Lab 12/07/15 2020 12/08/15 0453  NA 131* 132*  K 4.1 4.8  CL 101 102  CO2 23 24  GLUCOSE 108* 116*  BUN 35* 34*  CREATININE 1.82* 1.89*  CALCIUM 10.4* 10.1  AST  --  30  ALT  --  16*  ALKPHOS  --  62  BILITOT  --  0.5   ------------------------------------------------------------------------------------------------------------------ CrCl cannot be calculated (Unknown ideal weight.). ------------------------------------------------------------------------------------------------------------------ No results for input(s): TSH, T4TOTAL, T3FREE, THYROIDAB in the last 72 hours.  Invalid input(s): FREET3   Coagulation profile No results for input(s): INR, PROTIME in the last 168  hours. ------------------------------------------------------------------------------------------------------------------- No results for input(s): DDIMER in the last 72 hours. -------------------------------------------------------------------------------------------------------------------  Cardiac Enzymes  Recent Labs Lab 12/08/15 0453  TROPONINI 0.23*   ------------------------------------------------------------------------------------------------------------------ Invalid input(s): POCBNP  ---------------------------------------------------------------------------------------------------------------  Urinalysis    Component Value Date/Time   COLORURINE YELLOW* 12/08/2015 0453   COLORURINE Yellow 09/16/2012 1800   APPEARANCEUR CLEAR* 12/08/2015 0453   APPEARANCEUR Clear 09/16/2012 1800   LABSPEC 1.017 12/08/2015 0453   LABSPEC 1.005 09/16/2012 1800   PHURINE 6.0 12/08/2015 0453   PHURINE 6.0 09/16/2012 1800   GLUCOSEU NEGATIVE 12/08/2015 0453   GLUCOSEU Negative 09/16/2012 1800   HGBUR NEGATIVE 12/08/2015 0453   HGBUR 1+ 09/16/2012 1800   HGBUR negative 03/06/2007 1031   BILIRUBINUR NEGATIVE 12/08/2015 0453   BILIRUBINUR Negative 09/16/2012 1800   KETONESUR NEGATIVE 12/08/2015 0453   KETONESUR Negative 09/16/2012 1800   PROTEINUR 100* 12/08/2015 0453   PROTEINUR Negative 09/16/2012 1800   UROBILINOGEN 0.2 03/06/2007 1031   NITRITE NEGATIVE 12/08/2015 0453   NITRITE Negative 09/16/2012 1800   LEUKOCYTESUR NEGATIVE 12/08/2015 0453   LEUKOCYTESUR Trace 09/16/2012 1800     RADIOLOGY: Ct Head Wo Contrast  12/07/2015  CLINICAL DATA:  Fall from wheelchair with head injury, initial encounter EXAM: CT HEAD WITHOUT CONTRAST TECHNIQUE: Contiguous axial images were obtained from the base of the skull through the vertex without intravenous contrast. COMPARISON:  10/19/2014 FINDINGS: Right forehead hematoma is noted. No underlying bony abnormality is seen. Some fluid is noted  within the mastoid air cells on the left. Generalized atrophy is noted. Bilateral MCA calcifications are noted. Scattered areas of chronic white matter ischemic change is seen. No findings to suggest acute hemorrhage, acute infarction or space-occupying mass lesion are noted. 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 IMPRESSION: Chronic atrophic and ischemic changes. No acute intracranial abnormality is noted. Right frontal forehead hematoma Electronically Signed   By: Alcide Clever M.D.   On: 12/07/2015 16:32   Dg Chest Portable 1 View  12/08/2015  CLINICAL DATA:  Hypoxia. Cough. Patient has been here 2 times previously. EXAM: PORTABLE CHEST 1 VIEW COMPARISON:  09/16/2012 FINDINGS: Shallow inspiration. Atelectasis or infiltration in the right lung base. Mild cardiac enlargement. Pulmonary vascularity is normal. Calcified and tortuous aorta. No blunting of costophrenic angles. No pneumothorax. IMPRESSION: Infiltration or atelectasis in the right lung base. Electronically Signed   By: Burman Nieves M.D.   On: 12/08/2015 04:48  EKG: Orders placed or performed during the hospital encounter of 12/08/15  . EKG 12-Lead  . EKG 12-Lead    IMPRESSION AND PLAN: 82 year old male patient with history of chronic kidney disease, diabetes mellitus type 2 hypertension, hyperlipidemia presented to the emergency room for fall at the facility. Admitting diagnosis 1. Accidental fall 2. Gait instability 3. Non-STEMI 4. Chronic kidney disease 5. Hypertension 6. Hyperlipidemia 7 atelectasis versus pneumonia right lung Treatment plan Admit patient to telemetry Aspirin 81 mg daily Start patient on Levaquin antibiotic intravenously. Cultures to be followed up. Cycle troponin and check echocardiogram Cardiogenic consultation IV fluid hydration and renal function follow-up Anticoagulate patient with Lovenox subcutaneously Supportive care.  All the records are reviewed and case discussed with  ED provider. Management plans discussed with the patient, family and they are in agreement.  CODE STATUS:FULL Code Status History    This patient does not have a recorded code status. Please follow your organizational policy for patients in this situation.       TOTAL TIME TAKING CARE OF THIS PATIENT: 50 minutes.    Ihor Austin M.D on 12/08/2015 at 6:41 AM  Between 7am to 6pm - Pager - [phone]  After 6pm go to [email or web address] - password [email or web address] [facility]  [facility] [place] [facility]  Office  [phone]  CC: Primary care physician; Crawford Givens, MD

## 2015-12-08 NOTE — Progress Notes (Signed)
PT Cancellation Note  Patient Details Name: Timothy GlassDaniel W Ngo MRN: [id number] DOB: 01/22/1923   Cancelled Treatment:    Reason Eval/Treat Not Completed: Medical issues which prohibited therapy. Chart reviewed. Pt with mildly elevated first troponin at 0.23. Pt is awaiting cardiology consult. Will hold PT evaluation until pt is assessed by cardiology to determine ACS vs demand/supply ischemia. Will perform PT evaluation once pt is cleared by cardiology.  Sharalyn InkJason D Keahi Mccarney PT, DPT   Lorma Heater 12/08/2015, 8:14 AM

## 2015-12-08 NOTE — Consult Note (Signed)
Timothy Ramos is a 82 y.o. male  [id number]  Primary Cardiologist: Timothy Ramos Reason for Consultation: elevated troponin  HPI: Timothy Ramos lives in an assisted living facility and feel from his wheelchair when he leaned too far over and toppled to the floor. He is sitting in bed eating lunch with a hematoma and bandage on his forehead. He denies any chest pain or shortness of breath. He denies any previous MI or cardiac issues, although he is quite hard of hearing and a bit confused.    Review of Systems: Negative for chest pain, shortness of breath, edema   Past Medical History  Diagnosis Date  . HTN (hypertension) 1992  . Renal insufficiency 04/1999    CDKIII-IV w CR ~ 2 per Timothy Ramos  . Diabetes mellitus, type 2 (HCC) 05/07/2002  . Gastroduodenitis 9/18.2000    EGD mild  . Intermittent vertigo 2011    better w physical therapy  . BPH (benign prostatic hyperplasia)     followed by urology   . Hyperlipidemia   . Anemia     due to renal disease per Timothy Ramos with heme    Medications Prior to Admission  Medication Sig Dispense Refill  . acetaminophen (TYLENOL) 325 MG tablet Take 650 mg by mouth every 6 (six) hours as needed for mild pain, fever or headache.    Marland Kitchen acidophilus (RISAQUAD) CAPS capsule Take 1 capsule by mouth daily.    Marland Kitchen alum & mag hydroxide-simeth (MAALOX PLUS) 400-400-40 MG/5ML suspension Take 30 mLs by mouth every 4 (four) hours as needed for indigestion.    Marland Kitchen aspirin EC 81 MG tablet Take 81 mg by mouth daily.    . carbamide peroxide (DEBROX) 6.5 % otic solution Place 5 drops into both ears 2 (two) times daily as needed (for ear itching/ear wax).    . cholecalciferol (VITAMIN D) 1000 UNITS tablet Take 2,000 Units by mouth daily.     . cloNIDine (CATAPRES) 0.1 MG tablet Take 0.1 mg by mouth every 12 (twelve) hours as needed (for elevated BP).    Marland Kitchen divalproex (DEPAKOTE SPRINKLE) 125 MG capsule Take 125-250 mg by mouth 2 (two) times daily. Pt takes one capsule in the morning  and two at bedtime.    Marland Kitchen esomeprazole (NEXIUM) 40 MG capsule Take 40 mg by mouth 2 (two) times daily before a meal.    . guaifenesin (ROBITUSSIN) 100 MG/5ML syrup Take 100 mg by mouth every 4 (four) hours as needed for cough.    Marland Kitchen HYDROcodone-acetaminophen (NORCO/VICODIN) 5-325 MG tablet Take 1 tablet by mouth every 4 (four) hours as needed for moderate pain.    Marland Kitchen lisinopril (PRINIVIL,ZESTRIL) 10 MG tablet Take 10 mg by mouth daily.    . magnesium hydroxide (MILK OF MAGNESIA) 400 MG/5ML suspension Take 30 mLs by mouth daily as needed for mild constipation.    . Multiple Vitamin (MULTIVITAMIN WITH MINERALS) TABS tablet Take 1 tablet by mouth daily.    . nitroGLYCERIN (NITROSTAT) 0.4 MG SL tablet Place 0.4 mg under the tongue every 5 (five) minutes as needed for chest pain.    Marland Kitchen senna-docusate (SENOKOT-S) 8.6-50 MG tablet Take 1 tablet by mouth at bedtime.    . simethicone (MYLICON) 80 MG chewable tablet Chew 80 mg by mouth every 6 (six) hours as needed for flatulence.    . sucralfate (CARAFATE) 1 g tablet Take 1 g by mouth 4 (four) times daily -  with meals and at bedtime.       Marland Kitchen  acidophilus  1 capsule Oral Daily  . [START ON 12/09/2015] aspirin EC  81 mg Oral Daily  . cholecalciferol  2,000 Units Oral Daily  . divalproex  125 mg Oral q morning - 10a  . divalproex  250 mg Oral QHS  . enoxaparin (LOVENOX) injection  30 mg Subcutaneous Q24H  . [START ON 12/10/2015] levofloxacin (LEVAQUIN) IV  750 mg Intravenous Q48H  . lisinopril  10 mg Oral Daily  . multivitamin with minerals  1 tablet Oral Daily  . pantoprazole  40 mg Oral Daily  . senna-docusate  1 tablet Oral QHS  . sodium chloride flush  3 mL Intravenous Q12H  . sucralfate  1 g Oral TID WC & HS    Infusions: . sodium chloride 75 mL/hr at 12/08/15 2130    Allergies  Allergen Reactions  . Doxazosin Mesylate Other (See Comments)    Reaction:  Unknown   . Hydrochlorothiazide W-Triamterene Other (See Comments)    Reaction:  Unknown    . Penicillins Other (See Comments)    Reaction:  Unknown   . Simvastatin Other (See Comments)    Reaction:  Unknown     Social History   Social History  . Marital Status: Married    Spouse Name: N/A  . Number of Children: 2  . Years of Education: N/A   Occupational History  . retired Education officer, environmental    Social History Main Topics  . Smoking status: Never Smoker   . Smokeless tobacco: Not on file     Comment: Remote tobacco   . Alcohol Use: No  . Drug Use: No  . Sexual Activity: Not on file   Other Topics Concern  . Not on file   Social History Narrative    Married,widowed: disabled. Lives alone.     Family History  Problem Relation Age of Onset  . Stroke Father   . Stroke Sister   . Stroke Sister   . Pneumonia Sister     after fx hip    PHYSICAL EXAM: Filed Vitals:   12/08/15 0801 12/08/15 1100  BP: 118/55 110/56  Pulse: 81 80  Temp: 98.2 F (36.8 C) 98.1 F (36.7 C)  Resp:  16     Intake/Output Summary (Last 24 hours) at 12/08/15 1322 Last data filed at 12/08/15 0830  Gross per 24 hour  Intake    120 ml  Output      0 ml  Net    120 ml    General:  Well appearing. No respiratory difficulty HEENT: normal Neck: supple. no JVD. Carotids 2+ bilat; no bruits. No lymphadenopathy or thryomegaly appreciated. Cor: PMI nondisplaced. Regular rate & rhythm. No rubs, gallops or murmurs. Lungs: clear Abdomen: soft, nontender, nondistended. No hepatosplenomegaly. No bruits or masses. Good bowel sounds. Extremities: no cyanosis, clubbing, rash, edema Neuro: alert & oriented x 3, cranial nerves grossly intact. moves all 4 extremities w/o difficulty. Affect pleasant.    Results for orders placed or performed during the Ramos encounter of 12/08/15 (from the past 24 hour(s))  CBC     Status: Abnormal   Collection Time: 12/08/15  4:53 AM  Result Value Ref Range   WBC 6.2 3.8 - 10.6 K/uL   RBC 3.68 (L) 4.40 - 5.90 MIL/uL   Hemoglobin 12.1 (L) 13.0 - 18.0 g/dL    HCT 86.5 (L) 78.4 - 52.0 %   MCV 94.5 80.0 - 100.0 fL   MCH 32.9 26.0 - 34.0 pg   MCHC 34.8 32.0 -  36.0 g/dL   RDW 16.112.7 09.611.5 - 04.514.5 %   Platelets 95 (L) 150 - 440 K/uL  Comprehensive metabolic panel     Status: Abnormal   Collection Time: 12/08/15  4:53 AM  Result Value Ref Range   Sodium 132 (L) 135 - 145 mmol/L   Potassium 4.8 3.5 - 5.1 mmol/L   Chloride 102 101 - 111 mmol/L   CO2 24 22 - 32 mmol/L   Glucose, Bld 116 (H) 65 - 99 mg/dL   BUN 34 (H) 6 - 20 mg/dL   Creatinine, Ser 4.091.89 (H) 0.61 - 1.24 mg/dL   Calcium 81.110.1 8.9 - 91.410.3 mg/dL   Total Protein 6.3 (L) 6.5 - 8.1 g/dL   Albumin 3.8 3.5 - 5.0 g/dL   AST 30 15 - 41 U/L   ALT 16 (L) 17 - 63 U/L   Alkaline Phosphatase 62 38 - 126 U/L   Total Bilirubin 0.5 0.3 - 1.2 mg/dL   GFR calc non Af Amer 30 (L) >60 mL/min   GFR calc Af Amer 34 (L) >60 mL/min   Anion gap 6 5 - 15  Troponin I     Status: Abnormal   Collection Time: 12/08/15  4:53 AM  Result Value Ref Range   Troponin I 0.23 (H) <0.031 ng/mL  Blood gas, venous     Status: Abnormal   Collection Time: 12/08/15  4:53 AM  Result Value Ref Range   pH, Ven 7.40 7.320 - 7.430   pCO2, Ven 39 (L) 44.0 - 60.0 mmHg   pO2, Ven <31.0 (L) 31.0 - 45.0 mmHg   Bicarbonate 24.2 21.0 - 28.0 mEq/L   Acid-base deficit 0.5 0.0 - 2.0 mmol/L   Patient temperature 37.0    Collection site VEIN    Sample type VEIN   Urinalysis complete, with microscopic ([facility] only)     Status: Abnormal   Collection Time: 12/08/15  4:53 AM  Result Value Ref Range   Color, Urine YELLOW (A) YELLOW   APPearance CLEAR (A) CLEAR   Glucose, UA NEGATIVE NEGATIVE mg/dL   Bilirubin Urine NEGATIVE NEGATIVE   Ketones, ur NEGATIVE NEGATIVE mg/dL   Specific Gravity, Urine 1.017 1.005 - 1.030   Hgb urine dipstick NEGATIVE NEGATIVE   pH 6.0 5.0 - 8.0   Protein, ur 100 (A) NEGATIVE mg/dL   Nitrite NEGATIVE NEGATIVE   Leukocytes, UA NEGATIVE NEGATIVE   RBC / HPF 0-5 0 - 5 RBC/hpf   WBC, UA 0-5 0 - 5 WBC/hpf    Bacteria, UA NONE SEEN NONE SEEN   Squamous Epithelial / LPF 0-5 (A) NONE SEEN   Mucous PRESENT   Blood culture (routine x 2)     Status: None (Preliminary result)   Collection Time: 12/08/15  6:26 AM  Result Value Ref Range   Specimen Description BLOOD BLOOD RIGHT FOREARM    Special Requests BOTTLES DRAWN AEROBIC AND ANAEROBIC 5ML    Culture NO GROWTH < 12 HOURS    Report Status PENDING   Blood culture (routine x 2)     Status: None (Preliminary result)   Collection Time: 12/08/15  6:27 AM  Result Value Ref Range   Specimen Description BLOOD LEFT HAND    Special Requests BOTTLES DRAWN AEROBIC AND ANAEROBIC 5ML    Culture NO GROWTH < 12 HOURS    Report Status PENDING   Troponin I     Status: Abnormal   Collection Time: 12/08/15  8:21 AM  Result Value Ref Range  Troponin I 0.25 (H) <0.031 ng/mL  Glucose, capillary     Status: Abnormal   Collection Time: 12/08/15 11:38 AM  Result Value Ref Range   Glucose-Capillary 100 (H) 65 - 99 mg/dL   Comment 1 Notify RN    Ct Head Wo Contrast  12/07/2015  CLINICAL DATA:  Fall from wheelchair with head injury, initial encounter EXAM: CT HEAD WITHOUT CONTRAST TECHNIQUE: Contiguous axial images were obtained from the base of the skull through the vertex without intravenous contrast. COMPARISON:  10/19/2014 FINDINGS: Right forehead hematoma is noted. No underlying bony abnormality is seen. Some fluid is noted within the mastoid air cells on the left. Generalized atrophy is noted. Bilateral MCA calcifications are noted. Scattered areas of chronic white matter ischemic change is seen. No findings to suggest acute hemorrhage, acute infarction or space-occupying mass lesion are noted. 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 IMPRESSION: Chronic atrophic and ischemic changes. No acute intracranial abnormality is noted. Right frontal forehead hematoma Electronically Signed   By: Alcide Clever M.D.   On: 12/07/2015 16:32   Dg Chest Portable 1  View  12/08/2015  CLINICAL DATA:  Hypoxia. Cough. Patient has been here 2 times previously. EXAM: PORTABLE CHEST 1 VIEW COMPARISON:  09/16/2012 FINDINGS: Shallow inspiration. Atelectasis or infiltration in the right lung base. Mild cardiac enlargement. Pulmonary vascularity is normal. Calcified and tortuous aorta. No blunting of costophrenic angles. No pneumothorax. IMPRESSION: Infiltration or atelectasis in the right lung base. Electronically Signed   By: Burman Nieves M.D.   On: 12/08/2015 04:48   Tele:  NSR 81 bpm  ASSESSMENT AND PLAN: Mildly elevated troponin, most likely related to demand ischemia. Telemetry shows normal sinus rhythm. His fall appears to have been related to loss of balance while leaning forward in wheelchair and falling to the floor. He denies any chest pain or shortness of breath.  Awaiting echo results. He should be fine to return to his residence when released by hospitalist.   Berton Bon, NP 12/08/2015 1:22 PM

## 2015-12-08 NOTE — ED Notes (Signed)
Changed pt to triage level 3 per EDP.

## 2015-12-08 NOTE — Progress Notes (Signed)
ANTICOAGULATION CONSULT NOTE - Initial Consult  Pharmacy Consult for Enoxaparin f Indication: chest pain/ACS  Allergies  Allergen Reactions  . Doxazosin Mesylate Other (See Comments)    Reaction:  Unknown   . Hydrochlorothiazide W-Triamterene Other (See Comments)    Reaction:  Unknown   . Penicillins Other (See Comments)    Reaction:  Unknown   . Simvastatin Other (See Comments)    Reaction:  Unknown     Patient Measurements: Height:  (180.3 cm) IBW/kg (Calculated) : 75.3 Heparin Dosing Weight:   Vital Signs: Temp: 98.2 F (36.8 C) (03/08 0801) Temp Source: Oral (03/08 0801) BP: 118/55 mmHg (03/08 0801) Pulse Rate: 81 (03/08 0801)  Labs:  Recent Labs  12/07/15 2020 12/08/15 0453  HGB 12.1* 12.1*  HCT 34.8* 34.8*  PLT 94* 95*  CREATININE 1.82* 1.89*  TROPONINI  --  0.23*    Estimated Creatinine Clearance: 27.2 mL/min (by C-G formula based on Cr of 1.89).   Medical History: Past Medical History  Diagnosis Date  . HTN (hypertension) 1992  . Renal insufficiency 04/1999    CDKIII-IV w CR ~ 2 per [facility]  . Diabetes mellitus, type 2 (HCC) 05/07/2002  . Gastroduodenitis 9/18.2000    EGD mild  . Intermittent vertigo 2011    better w physical therapy  . BPH (benign prostatic hyperplasia)     followed by urology   . Hyperlipidemia   . Anemia     due to renal disease per Dr Park Breed with heme    Medications:  Prescriptions prior to admission  Medication Sig Dispense Refill Last Dose  . acetaminophen (TYLENOL) 325 MG tablet Take 650 mg by mouth every 6 (six) hours as needed for mild pain, fever or headache.   12/07/2015 at Unknown time  . acidophilus (RISAQUAD) CAPS capsule Take 1 capsule by mouth daily.   12/07/2015 at Unknown time  . alum & mag hydroxide-simeth (MAALOX PLUS) 400-400-40 MG/5ML suspension Take 30 mLs by mouth every 4 (four) hours as needed for indigestion.   12/07/2015 at Unknown time  . aspirin EC 81 MG tablet Take 81 mg by mouth daily.   12/07/2015 at  Unknown time  . carbamide peroxide (DEBROX) 6.5 % otic solution Place 5 drops into both ears 2 (two) times daily as needed (for ear itching/ear wax).   12/07/2015 at Unknown time  . cholecalciferol (VITAMIN D) 1000 UNITS tablet Take 2,000 Units by mouth daily.    12/07/2015 at Unknown time  . cloNIDine (CATAPRES) 0.1 MG tablet Take 0.1 mg by mouth every 12 (twelve) hours as needed (for elevated BP).   12/07/2015 at Unknown time  . divalproex (DEPAKOTE SPRINKLE) 125 MG capsule Take 125-250 mg by mouth 2 (two) times daily. Pt takes one capsule in the morning and two at bedtime.   12/07/2015 at Unknown time  . esomeprazole (NEXIUM) 40 MG capsule Take 40 mg by mouth 2 (two) times daily before a meal.   12/07/2015 at Unknown time  . guaifenesin (ROBITUSSIN) 100 MG/5ML syrup Take 100 mg by mouth every 4 (four) hours as needed for cough.   12/07/2015 at Unknown time  . HYDROcodone-acetaminophen (NORCO/VICODIN) 5-325 MG tablet Take 1 tablet by mouth every 4 (four) hours as needed for moderate pain.   12/07/2015 at Unknown time  . lisinopril (PRINIVIL,ZESTRIL) 10 MG tablet Take 10 mg by mouth daily.   12/07/2015 at Unknown time  . magnesium hydroxide (MILK OF MAGNESIA) 400 MG/5ML suspension Take 30 mLs by mouth daily as needed  for mild constipation.   12/07/2015 at Unknown time  . Multiple Vitamin (MULTIVITAMIN WITH MINERALS) TABS tablet Take 1 tablet by mouth daily.   12/07/2015 at Unknown time  . nitroGLYCERIN (NITROSTAT) 0.4 MG SL tablet Place 0.4 mg under the tongue every 5 (five) minutes as needed for chest pain.   12/07/2015 at Unknown time  . senna-docusate (SENOKOT-S) 8.6-50 MG tablet Take 1 tablet by mouth at bedtime.   12/07/2015 at Unknown time  . simethicone (MYLICON) 80 MG chewable tablet Chew 80 mg by mouth every 6 (six) hours as needed for flatulence.   12/07/2015 at Unknown time  . sucralfate (CARAFATE) 1 g tablet Take 1 g by mouth 4 (four) times daily -  with meals and at bedtime.   12/07/2015 at Unknown time    Scheduled:  . acidophilus  1 capsule Oral Daily  . aspirin  324 mg Oral NOW   Or  . aspirin  300 mg Rectal NOW  . [START ON 12/09/2015] aspirin EC  81 mg Oral Daily  . cholecalciferol  2,000 Units Oral Daily  . divalproex  125 mg Oral q morning - 10a  . divalproex  250 mg Oral QHS  . enoxaparin (LOVENOX) injection  1 mg/kg Subcutaneous Q24H  . [START ON 12/10/2015] levofloxacin (LEVAQUIN) IV  750 mg Intravenous Q48H  . lisinopril  10 mg Oral Daily  . multivitamin with minerals  1 tablet Oral Daily  . pantoprazole  40 mg Oral Daily  . senna-docusate  1 tablet Oral QHS  . sodium chloride flush  3 mL Intravenous Q12H  . sucralfate  1 g Oral TID WC & HS    Assessment: Pharmacy consulted to dose and monitor enoxaparin therapy in this 82 year old male with history of hypertension, DM and Hyperlipidemia being treated for ACS.    Goal of Therapy:   Monitor platelets by anticoagulation protocol: Yes   Plan:  Lovenox  Will give Enoxaparin 75 mg (1 mg/kg) SQ daily   Shigeru Lampert D 12/08/2015,8:13 AM

## 2015-12-08 NOTE — Progress Notes (Signed)
Told Dr. Juliene PinaMody that patient believes he is a diabetic.

## 2015-12-08 NOTE — Progress Notes (Signed)
Troponins up to 0.29.  Dr. Juliene PinaMody aware.

## 2015-12-08 NOTE — Progress Notes (Signed)
Patient alert and oriented to self.  VERY hard of hearing.  No complaints of pain today.  Using urinal on side of bed.  NSR on monitor.

## 2015-12-08 NOTE — Clinical Social Work Note (Signed)
Clinical Social Work Assessment  Patient Details  Name: Timothy Ramos MRN: [id number] Date of Birth: 01/06/1923  Date of referral:  12/08/15               Reason for consult:  Discharge Planning                Permission sought to share information with:    Permission granted to share information::     Name::        Agency::     Relationship::     Contact Information:     Housing/Transportation Living arrangements for the past 2 months:  [place] of Information:  Patient Patient Interpreter Needed:  None Criminal Activity/Legal Involvement Pertinent to Current Situation/Hospitalization:  No - Comment as needed Significant Relationships:    Lives with:  Facility Resident Do you feel safe going back to the place where you live?  Yes Need for family participation in patient care:  Yes (Comment)  Care giving concerns:  Patient is from [employer] / plan:  CSW was consulted due to patient being a resident at [facility]. CSW met with patient at bedside. Patient is alert to self. CSW attempted to call emergency contacts listed on patient's facesheet. Left voicemail for [facility] [phone]. Awaiting call back.  CSW contacted [place]- Development worker, international aid at [facility]. Per Doug patient is a [facility]isted [facility]. He reports that patient can return at discharge.    FL2/ PASRR completed and faxed to [facility] via [place]. CSW will continue to follow and assit.   Employment status:  Retired Forensic scientist:  Medicare PT Recommendations:  Not assessed at this time Information / Referral to community resources:     Patient/Family's Response to care:  Patient was not able to discuss discharge plans with CSW. CSW attempted to contact patient's emergency contacts.   Patient/Family's Understanding of and Emotional Response to Diagnosis, Current Treatment, and Prognosis:  Patient was not able to discuss  discharge plans with CSW.  Emotional Assessment Appearance:  Appears stated age Attitude/Demeanor/Rapport:   (None) Affect (typically observed):  Irritable Orientation:  Oriented to Self Alcohol / Substance use:  Not Applicable Psych involvement (Current and /or in the community):  No (Comment)  Discharge Needs  Concerns to be addressed:  Discharge Planning Concerns Readmission within the last 30 days:  No Current discharge risk:  Chronically ill Barriers to Discharge:  Continued Medical Work up   [employer], LCSW 12/08/2015, 3:57 PM

## 2015-12-08 NOTE — [place] FL2 (Signed)
MEDICAID FL2 LEVEL OF CARE SCREENING TOOL     IDENTIFICATION  Patient Name: Timothy Ramos Birthdate: May 14, 1923 Sex: male Admission Date (Current Location): 12/08/2015  [place] Number:  Chiropodist and Address:  [facility], [address] [place], [place] [address]      Provider Number: [id number]  Attending Physician Name and Address:  Ihor Austin, MD  Relative Name and Phone Number:       Current Level of Care: Hospital Recommended Level of Care: Skilled Nursing Facility  Prior Approval Number:    Date Approved/Denied:   PASRR Number:  [id number].)  Discharge Plan: SNF    Current Diagnoses: Patient Active Problem List   Diagnosis Date Noted  . Fall 12/08/2015  . Non-STEMI (non-ST elevated myocardial infarction) (HCC) 12/08/2015  . Atelectasis 12/08/2015  . DIZZINESS 05/13/2010  . NAUSEA 05/06/2010  . OSTEOPENIA 06/17/2008  . VITAMIN D DEFICIENCY 12/11/2007  . DYSLIPIDEMIA 11/27/2007  . HEARING LOSS 11/27/2007  . CAD 11/27/2007  . GASTRITIS, ANTRAL 11/27/2007  . ANEMIA NOS 04/26/2007  . HYPERTENSION, BENIGN 04/19/2007  . HYPERTROPHY PROSTATE BNG W/URINARY OBST/LUTS 04/02/2007  . FATIGUE, ACUTE 04/02/2007  . ELEVATED C-REACTIVE PROTEIN 03/19/2007  . HYPERPARATHYROIDISM, SECONDARY 03/06/2007  . Nocturia 03/06/2007  . ALLERGY 03/06/2007  . DIABETES MELLITUS, TYPE II 05/07/2002  . HIATAL HERNIA 06/20/1999    Orientation RESPIRATION BLADDER Height & Weight     Self, Time, Situation, Place  Normal Continent Weight:   Height:   (180.3 cm)  BEHAVIORAL SYMPTOMS/MOOD NEUROLOGICAL BOWEL NUTRITION STATUS   (None)  (None) Continent Diet (2 gram sodium )  AMBULATORY STATUS COMMUNICATION OF NEEDS Skin   Extensive Assist Verbally Normal                       Personal Care Assistance Level of Assistance  Bathing, Feeding, Dressing Bathing Assistance: Limited assistance Feeding assistance:  Independent Dressing Assistance: Limited assistance     Functional Limitations Info  Sight, Hearing, Speech Sight Info: Adequate Hearing Info: Adequate Speech Info: Adequate    SPECIAL CARE FACTORS FREQUENCY  PT (By licensed PT)     PT Frequency:  (5)              Contractures      Additional Factors Info  Code Status, Allergies Code Status Info:  (Full Code) Allergies Info:  (Doxazosin Mesylate, Hydrochlorothiazide W-triamterene, Penicillins, Simvastatin)           Current Medications (12/08/2015):  This is the current hospital active medication list Current Facility-Administered Medications  Medication Dose Route Frequency Provider Last Rate Last Dose  . 0.9 %  sodium chloride infusion  250 mL Intravenous PRN Pavan Pyreddy, MD      . 0.9 %  sodium chloride infusion   Intravenous Continuous Ihor Austin, MD 75 mL/hr at 12/08/15 5621    . acetaminophen (TYLENOL) tablet 650 mg  650 mg Oral Q6H PRN Ihor Austin, MD      . acetaminophen (TYLENOL) tablet 650 mg  650 mg Oral Q4H PRN Pavan Pyreddy, MD      . acidophilus (RISAQUAD) capsule 1 capsule  1 capsule Oral Daily Ihor Austin, MD   1 capsule at 12/08/15 0828  . alum & mag hydroxide-simeth (MAALOX/MYLANTA) 200-200-20 MG/5ML suspension 30 mL  30 mL Oral Q4H PRN Ihor Austin, MD      . Melene Muller ON 12/09/2015] aspirin EC tablet 81 mg  81 mg Oral Daily Pavan  Pyreddy, MD      . cholecalciferol (VITAMIN D) tablet 2,000 Units  2,000 Units Oral Daily Ihor AustinPavan Pyreddy, MD   2,000 Units at 12/08/15 0831  . divalproex (DEPAKOTE SPRINKLE) capsule 125 mg  125 mg Oral q morning - 10a Pavan Pyreddy, MD      . divalproex (DEPAKOTE SPRINKLE) capsule 250 mg  250 mg Oral QHS Pavan Pyreddy, MD      . enoxaparin (LOVENOX) injection 75 mg  1 mg/kg Subcutaneous Q24H Ihor AustinPavan Pyreddy, MD   75 mg at 12/08/15 0831  . guaiFENesin (ROBITUSSIN) 100 MG/5ML solution 100 mg  100 mg Oral Q4H PRN Ihor AustinPavan Pyreddy, MD      . HYDROcodone-acetaminophen  (NORCO/VICODIN) 5-325 MG per tablet 1 tablet  1 tablet Oral Q4H PRN Ihor AustinPavan Pyreddy, MD      . Melene Muller[START ON 12/10/2015] levofloxacin (LEVAQUIN) IVPB 750 mg  750 mg Intravenous Q48H Pavan Pyreddy, MD      . lisinopril (PRINIVIL,ZESTRIL) tablet 10 mg  10 mg Oral Daily Ihor AustinPavan Pyreddy, MD   10 mg at 12/08/15 0831  . magnesium hydroxide (MILK OF MAGNESIA) suspension 30 mL  30 mL Oral Daily PRN Ihor AustinPavan Pyreddy, MD      . multivitamin with minerals tablet 1 tablet  1 tablet Oral Daily Ihor AustinPavan Pyreddy, MD   1 tablet at 12/08/15 0832  . nitroGLYCERIN (NITROSTAT) SL tablet 0.4 mg  0.4 mg Sublingual Q5 Min x 3 PRN Pavan Pyreddy, MD      . ondansetron (ZOFRAN) injection 4 mg  4 mg Intravenous Q6H PRN Pavan Pyreddy, MD      . pantoprazole (PROTONIX) EC tablet 40 mg  40 mg Oral Daily Ihor AustinPavan Pyreddy, MD   40 mg at 12/08/15 0831  . senna-docusate (Senokot-S) tablet 1 tablet  1 tablet Oral QHS Pavan Pyreddy, MD      . simethicone (MYLICON) chewable tablet 80 mg  80 mg Oral Q6H PRN Pavan Pyreddy, MD      . sodium chloride flush (NS) 0.9 % injection 3 mL  3 mL Intravenous Q12H Pavan Pyreddy, MD   3 mL at 12/08/15 1000  . sodium chloride flush (NS) 0.9 % injection 3 mL  3 mL Intravenous PRN Pavan Pyreddy, MD      . sucralfate (CARAFATE) tablet 1 g  1 g Oral TID WC & HS Ihor AustinPavan Pyreddy, MD   1 g at 12/08/15 [id number]     Discharge Medications: Please see discharge summary for a list of discharge medications.  Relevant Imaging Results:  Relevant Lab Results:   Additional Information  (SSN [id number])  Verta Ellenhristina E Chayton Murata, LCSW

## 2015-12-09 ENCOUNTER — Encounter: Payer: Self-pay | Admitting: *Deleted

## 2015-12-09 LAB — TROPONIN I
TROPONIN I: 0.17 ng/mL — AB (ref <0.031)
Troponin I: 0.15 ng/mL — ABNORMAL HIGH (ref <0.031)
Troponin I: 0.14 ng/mL — ABNORMAL HIGH (ref <0.031)

## 2015-12-09 LAB — MRSA PCR SCREENING: MRSA BY PCR: NEGATIVE

## 2015-12-09 NOTE — Progress Notes (Signed)
CSW was informed by PT that patient will need SNF at discharge. CSW called patient's daughterTheophilus Kinds- Alvalena Manring [phone]. Left voicemail. CSW called NorwayDoug- admissions coordinator at [facility]. He reports that patient may transfer from their ALF to SNF for short-term rehab at discharge. CSW will continue to follow and assist.  Woodroe Modehristina Jessicah Croll, MSW, LCSW-A Clinical Social Work Department [phone]

## 2015-12-09 NOTE — Progress Notes (Signed)
[facility] Physicians - [place] at [facility]   PATIENT NAME: Timothy Ramos    MR#:  [id number]  DATE OF BIRTH:  1922-12-15  SUBJECTIVE:  No acute events overnight. Patient is feeling much better. Still with cough denies chest pain patient is breath  REVIEW OF SYSTEMS:    Review of Systems  Constitutional: Negative for fever, chills and malaise/fatigue.  HENT: Negative for sore throat.   Eyes: Negative for blurred vision.  Respiratory: Positive for cough. Negative for hemoptysis, shortness of breath and wheezing.   Cardiovascular: Negative for chest pain, palpitations and leg swelling.  Gastrointestinal: Negative for nausea, vomiting, abdominal pain, diarrhea and blood in stool.  Genitourinary: Negative for dysuria.  Musculoskeletal: Negative for back pain.  Neurological: Negative for dizziness, tremors and headaches.  Endo/Heme/Allergies: Does not bruise/bleed easily.    Tolerating Diet:yes      DRUG ALLERGIES:   Allergies  Allergen Reactions  . Doxazosin Mesylate Other (See Comments)    Reaction:  Unknown   . Hydrochlorothiazide W-Triamterene Other (See Comments)    Reaction:  Unknown   . Penicillins Other (See Comments)    Reaction:  Unknown   . Simvastatin Other (See Comments)    Reaction:  Unknown     VITALS:  Blood pressure 128/55, pulse 63, temperature 98.1 F (36.7 C), temperature source Oral, resp. rate 16, height  (1.803 m), weight 81.149 kg (178 lb 14.4 oz), SpO2 98 %.  PHYSICAL EXAMINATION:   Physical Exam  Constitutional: He is oriented to person, place, and time and well-developed, well-nourished, and in no distress. No distress.  HENT:  Head: Normocephalic.  Eyes: No scleral icterus.  Neck: Normal range of motion. Neck supple. No JVD present. No tracheal deviation present.  Cardiovascular: Normal rate, regular rhythm and normal heart sounds.  Exam reveals no gallop and no friction rub.   No murmur heard. Pulmonary/Chest:  Effort normal and breath sounds normal. No respiratory distress. He has no wheezes. He has no rales. He exhibits no tenderness.  Abdominal: Soft. Bowel sounds are normal. He exhibits no distension and no mass. There is no tenderness. There is no rebound and no guarding.  Musculoskeletal: Normal range of motion. He exhibits no edema.  Neurological: He is alert and oriented to person, place, and time.  Skin: Skin is warm. No rash noted. No erythema.  Bruise on forehead and right eyelid  Psychiatric: Affect and judgment normal.      LABORATORY PANEL:   CBC  Recent Labs Lab 12/08/15 0453  WBC 6.2  HGB 12.1*  HCT 34.8*  PLT 95*   ------------------------------------------------------------------------------------------------------------------  Chemistries   Recent Labs Lab 12/08/15 0453  NA 132*  K 4.8  CL 102  CO2 24  GLUCOSE 116*  BUN 34*  CREATININE 1.89*  CALCIUM 10.1  AST 30  ALT 16*  ALKPHOS 62  BILITOT 0.5   ------------------------------------------------------------------------------------------------------------------  Cardiac Enzymes  Recent Labs Lab 12/08/15 0453 12/08/15 0821 12/08/15 1326  TROPONINI 0.23* 0.25* 0.29*   ------------------------------------------------------------------------------------------------------------------  RADIOLOGY:  Ct Head Wo Contrast  12/07/2015  CLINICAL DATA:  Fall from wheelchair with head injury, initial encounter EXAM: CT HEAD WITHOUT CONTRAST TECHNIQUE: Contiguous axial images were obtained from the base of the skull through the vertex without intravenous contrast. COMPARISON:  10/19/2014 FINDINGS: Right forehead hematoma is noted. No underlying bony abnormality is seen. Some fluid is noted within the mastoid air cells on the left. Generalized atrophy is noted. Bilateral MCA calcifications are noted. Scattered areas of  chronic white matter ischemic change is seen. No findings to suggest acute hemorrhage, acute  infarction or space-occupying mass lesion are noted. 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 IMPRESSION: Chronic atrophic and ischemic changes. No acute intracranial abnormality is noted. Right frontal forehead hematoma Electronically Signed   By: Alcide CleverMark  Lukens M.D.   On: 12/07/2015 16:32   Dg Chest Portable 1 View  12/08/2015  CLINICAL DATA:  Hypoxia. Cough. Patient has been here 2 times previously. EXAM: PORTABLE CHEST 1 VIEW COMPARISON:  09/16/2012 FINDINGS: Shallow inspiration. Atelectasis or infiltration in the right lung base. Mild cardiac enlargement. Pulmonary vascularity is normal. Calcified and tortuous aorta. No blunting of costophrenic angles. No pneumothorax. IMPRESSION: Infiltration or atelectasis in the right lung base. Electronically Signed   By: Burman NievesWilliam  Stevens M.D.   On: 12/08/2015 04:48     ASSESSMENT AND PLAN:   82 year old male with a history of essential hypertension and chronic kidney disease stage III presented with fall and confusion.  1. Metabolic encephalopathy: This is due to right lower lung pneumonia. He is at baseline.   2.HCAP: Continue Levaquin Blood  cultures are negative to date. 3. Elevated troponin: This is due to demand ischemia and not ACS. ECHO was limited and technically difficult. Systolic function was normal. The estimated ejection fraction was in the range of 50%  to 55%. Regional wall motion abnormalities cannot be excluded.  Select images with basal to mid anterior wall hypokinesis.  Doppler parameters are consistent with abnormal left ventricular  relaxation (grade 1 diastolic dysfunction). Continue aspirin. Follow troponins   4. Type 2 diabetes without complication: Continue sliding scale insulin  5. Essential hypertension: Continue clonidine and lisinopril.  no beta blocker due to bradycardia 6. Gastroduodenitis: Continue Carafate and PPI.  7. Chronic kidney disease stage III: Creatinine remained stable. Hold  nephrotoxic agents. .  Management plans discussed with the patient and daughter and he is in agreement.  CODE STATUS: DNR  TOTAL TIME TAKING CARE OF THIS PATIENT: 27 minutes.     POSSIBLE D/C tomorrow, DEPENDING ON CLINICAL CONDITION.   Tyresse Jayson M.D on 12/09/2015 at 10:35 AM  Between 7am to 6pm - Pager - [phone] After 6pm go to [email or web address] - password [email or web address] [facility]  [facility]  Office  [phone]  CC: Primary care physician; Crawford GivensGraham Duncan, MD  Note: This dictation was prepared with Dragon dictation along with smaller phrase technology. Any transcriptional errors that result from this process are unintentional.

## 2015-12-09 NOTE — Progress Notes (Signed)
SUBJECTIVE: Patient states that he's having intermittent cough with white colored sputum   Filed Vitals:   12/08/15 1100 12/08/15 2012 12/09/15 0503 12/09/15 0855  BP: 110/56 139/51 109/43 128/55  Pulse: 80 73 59 63  Temp: 98.1 F (36.7 C) 97.8 F (36.6 C) 98.1 F (36.7 C)   TempSrc: Oral Oral    Resp: 16 21 16    Height:      Weight:      SpO2:  97% 98%     Intake/Output Summary (Last 24 hours) at 12/09/15 0925 Last data filed at 12/08/15 2301  Gross per 24 hour  Intake      0 ml  Output    450 ml  Net   -450 ml    LABS: Basic Metabolic Panel:  Recent Labs  54/06/8102/07/17 2020 12/08/15 0453  NA 131* 132*  K 4.1 4.8  CL 101 102  CO2 23 24  GLUCOSE 108* 116*  BUN 35* 34*  CREATININE 1.82* 1.89*  CALCIUM 10.4* 10.1   Liver Function Tests:  Recent Labs  12/08/15 0453  AST 30  ALT 16*  ALKPHOS 62  BILITOT 0.5  PROT 6.3*  ALBUMIN 3.8   No results for input(s): LIPASE, AMYLASE in the last 72 hours. CBC:  Recent Labs  12/07/15 2020 12/08/15 0453  WBC 3.1* 6.2  NEUTROABS 2.4  --   HGB 12.1* 12.1*  HCT 34.8* 34.8*  MCV 95.5 94.5  PLT 94* 95*   Cardiac Enzymes:  Recent Labs  12/08/15 0453 12/08/15 0821 12/08/15 1326  TROPONINI 0.23* 0.25* 0.29*   BNP: Invalid input(s): POCBNP D-Dimer: No results for input(s): DDIMER in the last 72 hours. Hemoglobin A1C: No results for input(s): HGBA1C in the last 72 hours. Fasting Lipid Panel: No results for input(s): CHOL, HDL, LDLCALC, TRIG, CHOLHDL, LDLDIRECT in the last 72 hours. Thyroid Function Tests: No results for input(s): TSH, T4TOTAL, T3FREE, THYROIDAB in the last 72 hours.  Invalid input(s): FREET3 Anemia Panel: No results for input(s): VITAMINB12, FOLATE, FERRITIN, TIBC, IRON, RETICCTPCT in the last 72 hours.   PHYSICAL EXAM General: Well developed, well nourished, in no acute distress HEENT:  Normocephalic and atramatic Neck:  No JVD.  Lungs: Clear bilaterally to auscultation and  percussion. Heart: HRRR . Normal S1 and S2 without gallops or murmurs.  Abdomen: Bowel sounds are positive, abdomen soft and non-tender  Msk:  Back normal, normal gait. Normal strength and tone for age. Extremities: No clubbing, cyanosis or edema.   Neuro: Alert and oriented X 3. Psych:  Good affect, responds appropriately  TELEMETRY:Sinus rhythm  ASSESSMENT AND PLAN:Congestive heart failure hypertension hyperlipidemia and renal failure with mildly elevated troponin. Treat conservatively.  Principal Problem:   Non-STEMI (non-ST elevated myocardial infarction) [facility](HCC) Active Problems:   Fall   Atelectasis    Laurier NancyKHAN,Ajeenah Heiny A, MD, [facility] 12/09/2015 9:25 AM

## 2015-12-10 ENCOUNTER — Inpatient Hospital Stay: Payer: Medicare Other

## 2015-12-10 MED ORDER — ADULT MULTIVITAMIN W/MINERALS CH
1.0000 | ORAL_TABLET | ORAL | Status: DC
  Administered 2015-12-11 – 2015-12-12 (×2): 1 via ORAL
  Filled 2015-12-10 (×2): qty 1

## 2015-12-10 MED ORDER — CEPASTAT 14.5 MG MT LOZG
1.0000 | LOZENGE | OROMUCOSAL | Status: DC | PRN
  Administered 2015-12-10 (×2): 1 via BUCCAL
  Filled 2015-12-10: qty 9

## 2015-12-10 MED ORDER — LEVOFLOXACIN 750 MG PO TABS
750.0000 mg | ORAL_TABLET | ORAL | Status: DC
  Administered 2015-12-12: 750 mg via ORAL
  Filled 2015-12-10: qty 1

## 2015-12-10 MED ORDER — SUCRALFATE 1 G PO TABS
1.0000 g | ORAL_TABLET | ORAL | Status: DC
  Administered 2015-12-10 – 2015-12-12 (×6): 1 g via ORAL
  Filled 2015-12-10 (×6): qty 1

## 2015-12-10 NOTE — Care Management Important Message (Signed)
Important Message  Patient Details  Name: Neomia GlassDaniel W Tarleton MRN: [id number] Date of Birth: 12/27/1922   Medicare Important Message Given:  Yes    Olegario MessierKathy A Lauralee Waters 12/10/2015, 9:32 AM

## 2015-12-10 NOTE — Progress Notes (Signed)
SUBJECTIVE: Feeling much better   Filed Vitals:   12/09/15 0855 12/09/15 1145 12/09/15 1939 12/10/15 0341  BP: 128/55 121/51 144/56 141/64  Pulse: 63 68 69 74  Temp:  97.9 F (36.6 C) 98.4 F (36.9 C) 99.4 F (37.4 C)  TempSrc:   Oral Oral  Resp:  Height:      Weight:      SpO2:  99% 98% 98%    Intake/Output Summary (Last 24 hours) at 12/10/15 0821 Last data filed at 12/10/15 0704  Gross per 24 hour  Intake   2588 ml  Output    550 ml  Net   2038 ml    LABS: Basic Metabolic Panel:  Recent Labs  40/98/11 2020 12/08/15 0453  NA 131* 132*  K 4.1 4.8  CL 101 102  CO2 23 24  GLUCOSE 108* 116*  BUN 35* 34*  CREATININE 1.82* 1.89*  CALCIUM 10.4* 10.1   Liver Function Tests:  Recent Labs  12/08/15 0453  AST 30  ALT 16*  ALKPHOS 62  BILITOT 0.5  PROT 6.3*  ALBUMIN 3.8   No results for input(s): LIPASE, AMYLASE in the last 72 hours. CBC:  Recent Labs  12/07/15 2020 12/08/15 0453  WBC 3.1* 6.2  NEUTROABS 2.4  --   HGB 12.1* 12.1*  HCT 34.8* 34.8*  MCV 95.5 94.5  PLT 94* 95*   Cardiac Enzymes:  Recent Labs  12/09/15 1042 12/09/15 1836 12/09/15 2236  TROPONINI 0.17* 0.15* 0.14*   BNP: Invalid input(s): POCBNP D-Dimer: No results for input(s): DDIMER in the last 72 hours. Hemoglobin A1C: No results for input(s): HGBA1C in the last 72 hours. Fasting Lipid Panel: No results for input(s): CHOL, HDL, LDLCALC, TRIG, CHOLHDL, LDLDIRECT in the last 72 hours. Thyroid Function Tests: No results for input(s): TSH, T4TOTAL, T3FREE, THYROIDAB in the last 72 hours.  Invalid input(s): FREET3 Anemia Panel: No results for input(s): VITAMINB12, FOLATE, FERRITIN, TIBC, IRON, RETICCTPCT in the last 72 hours.   PHYSICAL EXAM General: Well developed, well nourished, in no acute distress HEENT:  Normocephalic and atramatic Neck:  No JVD.  Lungs: Clear bilaterally to auscultation and percussion. Heart: HRRR . Normal S1 and S2 without gallops or  murmurs.  Abdomen: Bowel sounds are positive, abdomen soft and non-tender  Msk:  Back normal, normal gait. Normal strength and tone for age. Extremities: No clubbing, cyanosis or edema.   Neuro: Alert and oriented X 3. Psych:  Good affect, responds appropriately  TELEMETRY:Sinus rhythm  ASSESSMENT AND PLAN: Mildly elevated troponin.  Principal Problem:   Non-STEMI (non-ST elevated myocardial infarction) [facility]) Active Problems:   Fall   Atelectasis    Elon Eoff A, MD, [facility] 12/10/2015 8:21 AM     SUBJECTIVE: Feeling much better   Filed Vitals:   12/09/15 0855 12/09/15 1145 12/09/15 1939 12/10/15 0341  BP: 128/55 121/51 144/56 141/64  Pulse: 63 68 69 74  Temp:  97.9 F (36.6 C) 98.4 F (36.9 C) 99.4 F (37.4 C)  TempSrc:   Oral Oral  Resp:  Height:      Weight:      SpO2:  99% 98% 98%    Intake/Output Summary (Last 24 hours) at 12/10/15 0821 Last data filed at 12/10/15 0704  Gross per 24 hour  Intake   2588 ml  Output    550 ml  Net   2038 ml    LABS: Basic Metabolic Panel:  Recent Labs  91/47/82 2020  12/08/15 0453  NA 131* 132*  K 4.1 4.8  CL 101 102  CO2 23 24  GLUCOSE 108* 116*  BUN 35* 34*  CREATININE 1.82* 1.89*  CALCIUM 10.4* 10.1   Liver Function Tests:  Recent Labs  12/08/15 0453  AST 30  ALT 16*  ALKPHOS 62  BILITOT 0.5  PROT 6.3*  ALBUMIN 3.8   No results for input(s): LIPASE, AMYLASE in the last 72 hours. CBC:  Recent Labs  12/07/15 2020 12/08/15 0453  WBC 3.1* 6.2  NEUTROABS 2.4  --   HGB 12.1* 12.1*  HCT 34.8* 34.8*  MCV 95.5 94.5  PLT 94* 95*   Cardiac Enzymes:  Recent Labs  12/09/15 1042 12/09/15 1836 12/09/15 2236  TROPONINI 0.17* 0.15* 0.14*   BNP: Invalid input(s): POCBNP D-Dimer: No results for input(s): DDIMER in the last 72 hours. Hemoglobin A1C: No results for input(s): HGBA1C in the last 72 hours. Fasting Lipid Panel: No results for input(s): CHOL, HDL, LDLCALC, TRIG, CHOLHDL,  LDLDIRECT in the last 72 hours. Thyroid Function Tests: No results for input(s): TSH, T4TOTAL, T3FREE, THYROIDAB in the last 72 hours.  Invalid input(s): FREET3 Anemia Panel: No results for input(s): VITAMINB12, FOLATE, FERRITIN, TIBC, IRON, RETICCTPCT in the last 72 hours.   PHYSICAL EXAM General: Well developed, well nourished, in no acute distress HEENT:  Normocephalic and atramatic Neck:  No JVD.  Lungs: Clear bilaterally to auscultation and percussion. Heart: HRRR . Normal S1 and S2 without gallops or murmurs.  Abdomen: Bowel sounds are positive, abdomen soft and non-tender  Msk:  Back normal, normal gait. Normal strength and tone for age. Extremities: No clubbing, cyanosis or edema.   Neuro: Alert and oriented X 3. Psych:  Good affect, responds appropriately  TELEMETRY:Sinus rhythm  ASSESSMENT AND PLAN: Mildly elevated troponin treat the patient conservatively.  Principal Problem:   Non-STEMI (non-ST elevated myocardial infarction) [facility](HCC) Active Problems:   Fall   Atelectasis    Laurier NancyKHAN,Gordie Belvin A, MD, [facility] 12/10/2015 8:21 AM

## 2015-12-10 NOTE — Progress Notes (Signed)
PT Cancellation Note  Patient Details Name: Timothy Ramos MRN: [id number] DOB: 09/09/1923   Cancelled Treatment:    Reason Eval/Treat Not Completed: Medical issues which prohibited therapy.  Pending ortho consult on L knee and doppler imaging on L LE due to L knee swelling.  Per protocol will hold PT at this time.  Pt will be seen at a later time and date as medically appropriate.  Timothy Ramos, SPT Timothy Ramos 12/10/2015, 9:20 AM

## 2015-12-10 NOTE — Progress Notes (Addendum)
[facility] [place] [facility]   PATIENT NAME: Timothy Ramos    MR#:  [id number]  DATE OF BIRTH:  [birth year]  SUBJECTIVE:  Patient with weakness left knee + cough  REVIEW OF SYSTEMS:    Review of Systems  Constitutional: Negative for fever, chills and malaise/fatigue.  HENT: Negative for sore throat.   Eyes: Negative for blurred vision.  Respiratory: Positive for cough. Negative for hemoptysis, shortness of breath and wheezing.   Cardiovascular: Negative for chest pain, palpitations and leg swelling.  Gastrointestinal: Negative for nausea, vomiting, abdominal pain, diarrhea and blood in stool.  Genitourinary: Negative for dysuria.  Musculoskeletal: Positive for joint pain and falls. Negative for back pain.  Neurological: Negative for dizziness, tremors and headaches.  Endo/Heme/Allergies: Does not bruise/bleed easily.    Tolerating Diet:yes      DRUG ALLERGIES:   Allergies  Allergen Reactions  . Doxazosin Mesylate Other (See Comments)    Reaction:  Unknown   . Hydrochlorothiazide W-Triamterene Other (See Comments)    Reaction:  Unknown   . Penicillins Other (See Comments)    Reaction:  Unknown   . Simvastatin Other (See Comments)    Reaction:  Unknown     VITALS:  Blood pressure 141/64, pulse 74, temperature 99.4 F (37.4 C), temperature source Oral, resp. rate 20, height  (1.803 m), weight 81.149 kg (178 lb 14.4 oz), SpO2 98 %.  PHYSICAL EXAMINATION:   Physical Exam  Constitutional: He is oriented to person, place, and time and well-developed, well-nourished, and in no distress. No distress.  HENT:  Head: Normocephalic.  Eyes: No scleral icterus.  Neck: Normal range of motion. Neck supple. No JVD present. No tracheal deviation present.  Cardiovascular: Normal rate, regular rhythm and normal heart sounds.  Exam reveals no gallop and no friction rub.   No murmur heard. Pulmonary/Chest: Effort normal and breath sounds  normal. No respiratory distress. He has no wheezes. He has no rales. He exhibits no tenderness.  Abdominal: Soft. Bowel sounds are normal. He exhibits no distension and no mass. There is no tenderness. There is no rebound and no guarding.  Musculoskeletal: Normal range of motion. He exhibits edema and tenderness.  Left knee swelling   Neurological: He is alert and oriented to person, place, and time.  Skin: Skin is warm. No rash noted. No erythema.  Bruise on forehead and right eyelid  Psychiatric: Affect and judgment normal.      LABORATORY PANEL:   CBC  Recent Labs Lab 12/08/15 0453  WBC 6.2  HGB 12.1*  HCT 34.8*  PLT 95*   ------------------------------------------------------------------------------------------------------------------  Chemistries   Recent Labs Lab 12/08/15 0453  NA 132*  K 4.8  CL 102  CO2 24  GLUCOSE 116*  BUN 34*  CREATININE 1.89*  CALCIUM 10.1  AST 30  ALT 16*  ALKPHOS 62  BILITOT 0.5   ------------------------------------------------------------------------------------------------------------------  Cardiac Enzymes  Recent Labs Lab 12/09/15 1042 12/09/15 1836 12/09/15 2236  TROPONINI 0.17* 0.15* 0.14*   ------------------------------------------------------------------------------------------------------------------  RADIOLOGY:  Dg Chest 1 View  12/10/2015  CLINICAL DATA:  Patient with history of pneumonia.  Diabetes. EXAM: CHEST 1 VIEW COMPARISON:  Chest radiograph 12/08/2015. FINDINGS: Monitoring leads overlie the patient. Stable cardiac and mediastinal contours. No consolidative pulmonary opacities. Left basilar airspace opacities. Thoracic spine degenerative changes. IMPRESSION: Left basilar atelectasis.  No acute cardiopulmonary process. Electronically Signed   By: Annia Belt M.D.   On: 12/10/2015 08:56   Dg Knee 1-2 Views Left  12/10/2015  CLINICAL DATA:  Fall from wheelchair 3 days ago with pain and swelling, initial  encounter EXAM: LEFT KNEE - 1-2 VIEW COMPARISON:  None. FINDINGS: Medial joint space narrowing is noted. No acute fracture is seen. A joint effusion is noted. IMPRESSION: Joint effusion without acute bony abnormality. Mild degenerative changes are seen. Electronically Signed   By: Alcide CleverMark  Lukens M.D.   On: 12/10/2015 09:02     ASSESSMENT AND PLAN:   82 year old male with a history of essential hypertension and chronic kidney disease stage III presented with fall and confusion.  1. Metabolic encephalopathy: This is due to right lower lung pneumonia. He is at baseline.   2.HCAP: Continue Levaquin Blood  cultures are negative to date. Chest x-ray this morning shows basilar atelectasis. Continue ISS.  3. Elevated troponin: This is due to demand ischemia and not ACS. ECHO was limited and technically difficult. Systolic function was normal. The estimated ejection fraction was in the range of 50%  to 55%. Regional wall motion abnormalities cannot be excluded.  Select images with basal to mid anterior wall hypokinesis.  Doppler parameters are consistent with abnormal left ventricular  relaxation (grade 1 diastolic dysfunction). Continue aspirin. Follow troponins   4. Diet-controlled Type 2 diabetes without complication: Continue sliding scale insulin  5. Essential hypertension: Continue clonidine and lisinopril.  no beta blocker due to bradycardia  6. Gastroduodenitis: Continue Carafate and PPI.  7. Chronic kidney disease stage III: Creatinine remained stable. Hold nephrotoxic agents. . 8. Left knee swelling: Edema seems new since yesterday. No falls since hospitalization. Suspect since patient has been lying in bed effusion is now seen. Orthopedic consultation for further evaluation and management. X-ray shows effusion Follow-up on LEEDoppler to evaluate for DVT.  9. Fall: Patient will need skilled nursing facility at discharge   Management plans discussed with the patient and  daughter and he is in agreement.  CODE STATUS: DNR  TOTAL TIME TAKING CARE OF THIS PATIENT: 32 minutes.     POSSIBLE D/C tomorrow, DEPENDING ON CLINICAL CONDITION.   Teyona Nichelson M.D on 12/10/2015 at 11:22 AM  Between 7am to 6pm - Pager - [phone] After 6pm go to [email or web address] - password [email or web address] [facility]  [facility]  Office  [phone]  CC: Primary care physician; Crawford GivensGraham Duncan, MD  Note: This dictation was prepared with Dragon dictation along with smaller phrase technology. Any transcriptional errors that result from this process are unintentional.

## 2015-12-10 NOTE — Progress Notes (Signed)
Pt complaining of cough, does not have anything for it, MD paged. Dr. Anne HahnWillis to put in orders for cough syrup & cough drops. Will continue to monitor. Shirley FriarAlexis Miller, RN

## 2015-12-10 NOTE — Progress Notes (Signed)
Pharmacy Antibiotic Note  Timothy Ramos is a 82 y.o. male admitted on 12/08/2015 with pneumonia.  Pharmacy has been consulted for levofloxacin dosing.  Plan: Patient currently on levaquin 750mg  IV Q48H and is improving per MD note. Will transition to oral levaquin at this time per protocol.    Height: 5\' 11"  (180.3 cm) Weight: 178 lb 14.4 oz (81.149 kg) IBW/kg (Calculated) : 75.3  Temp (24hrs), Avg:98.6 F (37 C), Min:97.9 F (36.6 C), Max:99.4 F (37.4 C)   Recent Labs Lab 12/07/15 2020 12/08/15 0453  WBC 3.1* 6.2  CREATININE 1.82* 1.89*    Estimated Creatinine Clearance: 27.7 mL/min (by C-G formula based on Cr of 1.89).    Allergies  Allergen Reactions  . Doxazosin Mesylate Other (See Comments)    Reaction:  Unknown   . Hydrochlorothiazide W-Triamterene Other (See Comments)    Reaction:  Unknown   . Penicillins Other (See Comments)    Reaction:  Unknown   . Simvastatin Other (See Comments)    Reaction:  Unknown     Antimicrobials this admission: Levofloxacin  3/8 >>    Microbiology results: 3/8 BCx: NGTD x 2   Thank you for allowing pharmacy to be a part of this patient's care.  Masie Bermingham C 12/10/2015 10:06 AM

## 2015-12-10 NOTE — Consult Note (Signed)
ORTHOPAEDIC CONSULTATION  PATIENT NAME: Timothy Ramos DOB: [birth year]  MRN: [id number]  REQUESTING PHYSICIAN: Adrian Saran, MD  Chief Complaint: Left knee swelling and pain  HPI: Timothy Ramos is a 82 y.o. male who complains of  left knee swelling and pain. He is a resident at [facility]. On 12/07/2015 he was leaning forward in his wheelchair to pick up an object from the floor and fell forward, apparently striking his head and knees. He was evaluated in the Emergency Department, although there is no mention of any significant left knee findings at that time. He was subsequently evaluated in the Emergency Department for some apparent mental status changes, but again no mention was made of left knee symptoms on the admission documentation. Since his hospitalization, physical therapy has attempted to work with the patient and he expressed complaints of bilateral lower extremity pain. Today he states that the right knee and leg pain is improved, although he still has left knee pain and swelling. He has difficulty with bending the knee due to the swelling and discomfort. Prior to this admission he was apparently using a walker or wheelchair for ambulation at [facility].  Past Medical History  Diagnosis Date  . HTN (hypertension) 1992  . Renal insufficiency 04/1999    CDKIII-IV w CR ~ 2 per [facility]  . Diabetes mellitus, type 2 (HCC) 05/07/2002  . Gastroduodenitis 9/18.2000    EGD mild  . Intermittent vertigo 2011    better w physical therapy  . BPH (benign prostatic hyperplasia)     followed by urology   . Hyperlipidemia   . 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 Tonsillectomy  1949  . Nasal polyp surgery  1949  . Colonoscopy  06/20/1999    int hemms  . Esophagogastroduodenoscopy  06/20/1999     mild gastroduodenitis  . 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 Abd u/s  01/11/2010     nml   Social History   Social History  . Marital Status: Married    Spouse Name: N/A  . Number of Children: 2  . Years of Education: N/A   Occupational History  . retired Education officer, environmental    Social History Main Topics  . Smoking status: Never Smoker   . Smokeless tobacco: None     Comment: Remote tobacco   . Alcohol Use: No  . Drug Use: No  . Sexual Activity: Not Asked   Other Topics Concern  . None   Social History Narrative    Married,widowed: disabled. Lives alone.    Family History  Problem Relation Age of Onset  . Stroke Father   . Stroke Sister   . Stroke Sister   . Pneumonia Sister     after fx hip   Allergies  Allergen Reactions  . Doxazosin Mesylate Other (See Comments)    Reaction:  Unknown   . Hydrochlorothiazide W-Triamterene Other (See Comments)    Reaction:  Unknown   . Penicillins Other (See Comments)    Reaction:  Unknown   . Simvastatin Other (See Comments)    Reaction:  Unknown    Prior to Admission medications   Medication Sig Start Date End Date Taking? Authorizing Provider  acetaminophen (TYLENOL) 325 MG tablet Take 650 mg by mouth every 6 (six) hours as needed for mild pain, fever or  headache.   Yes Historical Provider, MD  acidophilus (RISAQUAD) CAPS capsule Take 1 capsule by mouth daily.   Yes Historical Provider, MD  alum & mag hydroxide-simeth (MAALOX PLUS) 400-400-40 MG/5ML suspension Take 30 mLs by mouth every 4 (four) hours as needed for indigestion.   Yes Historical Provider, MD  aspirin EC 81 MG tablet Take 81 mg by mouth daily.   Yes Historical Provider, MD  carbamide peroxide (DEBROX) 6.5 % otic solution Place 5 drops into both ears 2 (two) times daily as needed (for ear itching/ear wax).   Yes Historical Provider, MD  cholecalciferol (VITAMIN D) 1000 UNITS tablet Take 2,000 Units by mouth daily.    Yes Historical Provider, MD  cloNIDine (CATAPRES) 0.1 MG tablet Take 0.1 mg by mouth every 12 (twelve) hours as needed (for elevated  BP).   Yes Historical Provider, MD  divalproex (DEPAKOTE SPRINKLE) 125 MG capsule Take 125-250 mg by mouth 2 (two) times daily. Pt takes one capsule in the morning and two at bedtime.   Yes Historical Provider, MD  esomeprazole (NEXIUM) 40 MG capsule Take 40 mg by mouth 2 (two) times daily before a meal.   Yes Historical Provider, MD  guaifenesin (ROBITUSSIN) 100 MG/5ML syrup Take 100 mg by mouth every 4 (four) hours as needed for cough.   Yes Historical Provider, MD  HYDROcodone-acetaminophen (NORCO/VICODIN) 5-325 MG tablet Take 1 tablet by mouth every 4 (four) hours as needed for moderate pain.   Yes Historical Provider, MD  lisinopril (PRINIVIL,ZESTRIL) 10 MG tablet Take 10 mg by mouth daily.   Yes Historical Provider, MD  magnesium hydroxide (MILK OF MAGNESIA) 400 MG/5ML suspension Take 30 mLs by mouth daily as needed for mild constipation.   Yes Historical Provider, MD  Multiple Vitamin (MULTIVITAMIN WITH MINERALS) TABS tablet Take 1 tablet by mouth daily.   Yes Historical Provider, MD  nitroGLYCERIN (NITROSTAT) 0.4 MG SL tablet Place 0.4 mg under the tongue every 5 (five) minutes as needed for chest pain.   Yes Historical Provider, MD  senna-docusate (SENOKOT-S) 8.6-50 MG tablet Take 1 tablet by mouth at bedtime.   Yes Historical Provider, MD  simethicone (MYLICON) 80 MG chewable tablet Chew 80 mg by mouth every 6 (six) hours as needed for flatulence.   Yes Historical Provider, MD  sucralfate (CARAFATE) 1 g tablet Take 1 g by mouth 4 (four) times daily -  with meals and at bedtime.   Yes Historical Provider, MD   Dg Chest 1 View  12/10/2015  CLINICAL DATA:  Patient with history of pneumonia.  Diabetes. EXAM: CHEST 1 VIEW COMPARISON:  Chest radiograph 12/08/2015. FINDINGS: Monitoring leads overlie the patient. Stable cardiac and mediastinal contours. No consolidative pulmonary opacities. Left basilar airspace opacities. Thoracic spine degenerative changes. IMPRESSION: Left basilar atelectasis.  No  acute cardiopulmonary process. Electronically Signed   By: Annia Belt M.D.   On: 12/10/2015 08:56   Dg Knee 1-2 Views Left  12/10/2015  CLINICAL DATA:  Fall from wheelchair 3 days ago with pain and swelling, initial encounter EXAM: LEFT KNEE - 1-2 VIEW COMPARISON:  None. FINDINGS: Medial joint space narrowing is noted. No acute fracture is seen. A joint effusion is noted. IMPRESSION: Joint effusion without acute bony abnormality. Mild degenerative changes are seen. Electronically Signed   By: Alcide Clever M.D.   On: 12/10/2015 09:02   US Venous Img Lower Unilateral Left  12/10/2015  CLINICAL DATA:  Left lower extremity swelling. EXAM: LEFT LOWER EXTREMITY VENOUS DOPPLER ULTRASOUND TECHNIQUE: Gray-scale  sonography with graded compression, as well as color Doppler and duplex ultrasound were performed to evaluate the lower extremity deep venous systems from the level of the common femoral vein and including the common femoral, femoral, profunda femoral, popliteal and calf veins including the posterior tibial, peroneal and gastrocnemius veins when visible. The superficial great saphenous vein was also interrogated. Spectral Doppler was utilized to evaluate flow at rest and with distal augmentation maneuvers in the common femoral, femoral and popliteal veins. COMPARISON:  No recent prior. FINDINGS: Contralateral Common Femoral Vein: Respiratory phasicity is normal and symmetric with the symptomatic side. No evidence of thrombus. Normal compressibility. Common Femoral Vein: No evidence of thrombus. Normal compressibility, respiratory phasicity and response to augmentation. Saphenofemoral Junction: No evidence of thrombus. Normal compressibility and flow on color Doppler imaging. Profunda Femoral Vein: No evidence of thrombus. Normal compressibility and flow on color Doppler imaging. Femoral Vein: No evidence of thrombus. Normal compressibility, respiratory phasicity and response to augmentation. Popliteal Vein: No  evidence of thrombus. Normal compressibility, respiratory phasicity and response to augmentation. Calf Veins: No evidence of thrombus. Normal compressibility and flow on color Doppler imaging. Superficial Great Saphenous Vein: No evidence of thrombus. Normal compressibility and flow on color Doppler imaging. Other Findings:  None. IMPRESSION: No evidence of deep venous thrombosis. Electronically Signed   By: Maisie Fushomas  Register   On: 12/10/2015 11:33    Positive ROS: Hard of hearing. All other systems have been reviewed and were otherwise negative with the exception of those mentioned in the HPI and as above.  Physical Exam: General: Alert and alert in no acute distress. HEENT: Normocephalic. Ecchymosis around the right eye Sclera are clear. Extraocular motion is intact. Oropharynx is clear with moist mucosa. Neck: Supple, nontender, good range of motion. No JVD or carotid bruits. Lungs: Clear to auscultation bilaterally. Cardiovascular: Regular rate and rhythm with normal S1 and S2. No murmurs. No gallops or rubs. Pedal pulses are palpable bilaterally. Homans test is negative bilaterally. No significant pretibial or ankle edema. Abdomen: Soft, nontender, and nondistended. Bowel sounds are present. Skin: No lesions in the area of chief complaint Neurologic: Awake, alert, and oriented. Sensory function is grossly intact. Motor strength is felt to be 5 over 5 bilaterally. No clonus or tremor. Good motor coordination. Lymphatic: No axillary or cervical lymphadenopathy  MUSCULOSKELETAL: Musculoskeletal exam is pertinent for findings involving the left knee. A relatively large left knee effusion is present. There is a small abrasion to the anterior aspect of the knee. No significant ecchymosis or erythema. The left knee is stable to varus and valgus stress. Lachman's test is negative. No palpable defect to the quadriceps tendon or patellar tendon. Good quadriceps tone is present. Knee flexion is limited to  approximately 45 secondary to guarding and swelling.  Assessment: Left knee effusion Contusion to the left knee  Plan: The findings were discussed with the patient. Orders were placed for: Therapy to the left knee. The patient has been anticoagulated since his admission, and he may have developed a hemarthrosis. We will see how he responds to conservative measures. We may need to consider aspiration knee for comfort.  Wyndham Santilli P. Angie FavaHooten, Jr. M.D.

## 2015-12-11 LAB — BASIC METABOLIC PANEL
ANION GAP: 8 (ref 5–15)
BUN: 40 mg/dL — ABNORMAL HIGH (ref 6–20)
CALCIUM: 9.5 mg/dL (ref 8.9–10.3)
CHLORIDE: 108 mmol/L (ref 101–111)
CO2: 19 mmol/L — AB (ref 22–32)
Creatinine, Ser: 1.97 mg/dL — ABNORMAL HIGH (ref 0.61–1.24)
GFR calc non Af Amer: 28 mL/min — ABNORMAL LOW (ref >60)
GFR, EST AFRICAN AMERICAN: 33 mL/min — AB (ref >60)
GLUCOSE: 86 mg/dL (ref 65–99)
POTASSIUM: 4.6 mmol/L (ref 3.5–5.1)
Sodium: 135 mmol/L (ref 135–145)

## 2015-12-11 LAB — CBC
HEMATOCRIT: 26.9 % — AB (ref 40.0–52.0)
HEMOGLOBIN: 9.3 g/dL — AB (ref 13.0–18.0)
MCH: 33.4 pg (ref 26.0–34.0)
MCHC: 34.5 g/dL (ref 32.0–36.0)
MCV: 96.9 fL (ref 80.0–100.0)
Platelets: 61 10*3/uL — ABNORMAL LOW (ref 150–440)
RBC: 2.78 MIL/uL — ABNORMAL LOW (ref 4.40–5.90)
RDW: 12.7 % (ref 11.5–14.5)
WBC: 2.9 10*3/uL — AB (ref 3.8–10.6)

## 2015-12-11 LAB — OCCULT BLOOD X 1 CARD TO LAB, STOOL: Fecal Occult Bld: NEGATIVE

## 2015-12-11 NOTE — Progress Notes (Addendum)
[facility] [place] [facility]   PATIENT NAME: Timothy Ramos    MR#:  [id number]  DATE OF BIRTH:  [birth year]  SUBJECTIVE:  better left knee pain and swelling   REVIEW OF SYSTEMS:    Review of Systems  Constitutional: Negative for fever, chills and malaise/fatigue.  HENT: Negative for sore throat.   Eyes: Negative for blurred vision.  Respiratory: No cough. Negative for hemoptysis, shortness of breath and wheezing.   Cardiovascular: Negative for chest pain, palpitations and leg swelling.  Gastrointestinal: Negative for nausea, vomiting, abdominal pain, diarrhea and blood in stool.  Genitourinary: Negative for dysuria.  Musculoskeletal: No joint pain and falls. Negative for back pain.  Neurological: Negative for dizziness, tremors and headaches.  Endo/Heme/Allergies: Does not bruise/bleed easily.    Tolerating Diet:yes  DRUG ALLERGIES:   Allergies  Allergen Reactions  . Doxazosin Mesylate Other (See Comments)    Reaction:  Unknown   . Hydrochlorothiazide W-Triamterene Other (See Comments)    Reaction:  Unknown   . Penicillins Other (See Comments)    Reaction:  Unknown   . Simvastatin Other (See Comments)    Reaction:  Unknown     VITALS:  Blood pressure 102/49, pulse 62, temperature 98.3 F (36.8 C), temperature source Oral, resp. rate 18, height  (1.803 m), weight 81.149 kg (178 lb 14.4 oz), SpO2 99 %.  PHYSICAL EXAMINATION:   Physical Exam  Constitutional: He is oriented to person, place, and time and well-developed, well-nourished, and in no distress. No distress.  HENT:  Head: Normocephalic.  Eyes: No scleral icterus.  Neck: Normal range of motion. Neck supple. No JVD present. No tracheal deviation present.  Cardiovascular: Normal rate, regular rhythm and normal heart sounds.  Exam reveals no gallop and no friction rub.   No murmur heard. Pulmonary/Chest: Effort normal and breath sounds normal. No respiratory distress. He  has no wheezes. He has no rales. He exhibits no tenderness.  Abdominal: Soft. Bowel sounds are normal. He exhibits no distension and no mass. There is no tenderness. There is no rebound and no guarding.  Musculoskeletal: Normal range of motion.  Edema but no tenderness on left knee. Neurological: He is alert and oriented to person, place, and time.  Skin: Skin is warm. No rash noted. No erythema.  Bruise on forehead and right eyelid  Psychiatric: Affect and judgment normal.      LABORATORY PANEL:   CBC  Recent Labs Lab 12/11/15 0532  WBC 2.9*  HGB 9.3*  HCT 26.9*  PLT 61*   ------------------------------------------------------------------------------------------------------------------  Chemistries   Recent Labs Lab 12/08/15 0453 12/11/15 0532  NA 132* 135  K 4.8 4.6  CL 102 108  CO2 24 19*  GLUCOSE 116* 86  BUN 34* 40*  CREATININE 1.89* 1.97*  CALCIUM 10.1 9.5  AST 30  --   ALT 16*  --   ALKPHOS 62  --   BILITOT 0.5  --    ------------------------------------------------------------------------------------------------------------------  Cardiac Enzymes  Recent Labs Lab 12/09/15 1042 12/09/15 1836 12/09/15 2236  TROPONINI 0.17* 0.15* 0.14*   ------------------------------------------------------------------------------------------------------------------  RADIOLOGY:  Dg Chest 1 View  12/10/2015  CLINICAL DATA:  Patient with history of pneumonia.  Diabetes. EXAM: CHEST 1 VIEW COMPARISON:  Chest radiograph 12/08/2015. FINDINGS: Monitoring leads overlie the patient. Stable cardiac and mediastinal contours. No consolidative pulmonary opacities. Left basilar airspace opacities. Thoracic spine degenerative changes. IMPRESSION: Left basilar atelectasis.  No acute cardiopulmonary process. Electronically Signed   By: Annia Belt  M.D.   On: 12/10/2015 08:56   Dg Knee 1-2 Views Left  12/10/2015  CLINICAL DATA:  Fall from wheelchair 3 days ago with pain and  swelling, initial encounter EXAM: LEFT KNEE - 1-2 VIEW COMPARISON:  None. FINDINGS: Medial joint space narrowing is noted. No acute fracture is seen. A joint effusion is noted. IMPRESSION: Joint effusion without acute bony abnormality. Mild degenerative changes are seen. Electronically Signed   By: Alcide CleverMark  Lukens M.D.   On: 12/10/2015 09:02   [place] Venous Img Lower Unilateral Left  12/10/2015  CLINICAL DATA:  Left lower extremity swelling. EXAM: LEFT LOWER EXTREMITY VENOUS DOPPLER ULTRASOUND TECHNIQUE: Gray-scale sonography with graded compression, as well as color Doppler and duplex ultrasound were performed to evaluate the lower extremity deep venous systems from the level of the common femoral vein and including the common femoral, femoral, profunda femoral, popliteal and calf veins including the posterior tibial, peroneal and gastrocnemius veins when visible. The superficial great saphenous vein was also interrogated. Spectral Doppler was utilized to evaluate flow at rest and with distal augmentation maneuvers in the common femoral, femoral and popliteal veins. COMPARISON:  No recent prior. FINDINGS: Contralateral Common Femoral Vein: Respiratory phasicity is normal and symmetric with the symptomatic side. No evidence of thrombus. Normal compressibility. Common Femoral Vein: No evidence of thrombus. Normal compressibility, respiratory phasicity and response to augmentation. Saphenofemoral Junction: No evidence of thrombus. Normal compressibility and flow on color Doppler imaging. Profunda Femoral Vein: No evidence of thrombus. Normal compressibility and flow on color Doppler imaging. Femoral Vein: No evidence of thrombus. Normal compressibility, respiratory phasicity and response to augmentation. Popliteal Vein: No evidence of thrombus. Normal compressibility, respiratory phasicity and response to augmentation. Calf Veins: No evidence of thrombus. Normal compressibility and flow on color Doppler imaging.  Superficial Great Saphenous Vein: No evidence of thrombus. Normal compressibility and flow on color Doppler imaging. Other Findings:  None. IMPRESSION: No evidence of deep venous thrombosis. Electronically Signed   By: Maisie Fushomas  Register   On: 12/10/2015 11:33     ASSESSMENT AND PLAN:   82 year old male with a history of essential hypertension and chronic kidney disease stage III presented with fall and confusion.  1. Metabolic encephalopathy: This is due to right lower lung pneumonia. He is at baseline.   2.HCAP: Continue Levaquin Blood  cultures are negative to date. Chest x-ray this morning shows basilar atelectasis.  3. Elevated troponin: This is due to demand ischemia and not ACS. ECHO was limited and technically difficult. Systolic function was normal. The estimated ejection fraction was in the range of 50%  to 55%. Regional wall motion abnormalities cannot be excluded.  Select images with basal to mid anterior wall hypokinesis.  Doppler parameters are consistent with abnormal left ventricular  relaxation (grade 1 diastolic dysfunction). Continue aspirin.  4. Diet-controlled Type 2 diabetes without complication: Continue sliding scale insulin  5. Essential hypertension: Continue clonidine and lisinopril.  no beta blocker due to bradycardia  6. Gastroduodenitis: Continue Carafate and PPI.  7. Chronic kidney disease stage III: a little worse. Hold nephrotoxic agents. F/u BMP. . 8. Left knee swelling: X-ray shows effusion. No DVT. Continue with conservative measures at this time. Physical therapy as tolerated.  9. Fall: Patient needs skilled nursing facility at discharge  * Hyponatremia. Improved. * Leukopenia. Unclear etiology, repeat CBC. * Anemia. Hemoglobin decreased from 12.1-9.3. No active bleeding, possible due to IV fluid dilution. Follow-up hemoglobin and stool occult.  All the records are reviewed and case discussed with Care  Management/Social  Workerr. Management plans discussed with the patient, family and they are in agreement.  CODE STATUS: DNR.  TOTAL TIME TAKING CARE OF THIS PATIENT: 33 minutes.  Greater than 50% time was spent on coordination of care and face-to-face counseling.  POSSIBLE D/C IN 1-2 DAYS, DEPENDING ON CLINICAL CONDITION.   Shaune Pollack M.D on 12/11/2015 at 3:51 PM  Between 7am to 6pm - Pager - [phone] After 6pm go to [email or web address] - password [email or web address] [facility]  [facility]  Office  [phone]  CC: Primary care physician; Crawford Givens, MD  Note: This dictation was prepared with Dragon dictation along with smaller phrase technology. Any transcriptional errors that result from this process are unintentional.

## 2015-12-11 NOTE — Consult Note (Signed)
ORTHOPEDICS- Patient reports feeling much better today. He reports less knee pain.  Examination of the left knee still demonstrates a moderate effusion. No erythema or ecchymosis. The patient demonstrates improved flexion today with less tenderness to palpation. A new ice pack was applied to the knee.  Continue with conservative measures at this time. Physical therapy as tolerated.  James P. Angie FavaHooten, Jr. M.D.

## 2015-12-12 LAB — CBC
HCT: 26.5 % — ABNORMAL LOW (ref 40.0–52.0)
Hemoglobin: 9.1 g/dL — ABNORMAL LOW (ref 13.0–18.0)
MCH: 32.7 pg (ref 26.0–34.0)
MCHC: 34.1 g/dL (ref 32.0–36.0)
MCV: 95.7 fL (ref 80.0–100.0)
PLATELETS: 57 10*3/uL — AB (ref 150–440)
RBC: 2.77 MIL/uL — ABNORMAL LOW (ref 4.40–5.90)
RDW: 13 % (ref 11.5–14.5)
WBC: 2.8 10*3/uL — AB (ref 3.8–10.6)

## 2015-12-12 LAB — BASIC METABOLIC PANEL
Anion gap: 7 (ref 5–15)
BUN: 45 mg/dL — AB (ref 6–20)
CALCIUM: 9.1 mg/dL (ref 8.9–10.3)
CO2: 19 mmol/L — ABNORMAL LOW (ref 22–32)
CREATININE: 2.02 mg/dL — AB (ref 0.61–1.24)
Chloride: 107 mmol/L (ref 101–111)
GFR calc Af Amer: 32 mL/min — ABNORMAL LOW (ref >60)
GFR, EST NON AFRICAN AMERICAN: 27 mL/min — AB (ref >60)
Glucose, Bld: 92 mg/dL (ref 65–99)
Potassium: 4.2 mmol/L (ref 3.5–5.1)
SODIUM: 133 mmol/L — AB (ref 135–145)

## 2015-12-12 MED ORDER — LEVOFLOXACIN 750 MG PO TABS: 750.0000 mg | ORAL_TABLET | ORAL | Status: AC

## 2015-12-12 MED ORDER — HYDROCODONE-ACETAMINOPHEN 5-325 MG PO TABS: 1.0000 | ORAL_TABLET | Freq: Four times a day (QID) | ORAL | Status: AC | PRN

## 2015-12-12 NOTE — Clinical Social Work Placement (Signed)
   CLINICAL SOCIAL WORK PLACEMENT  NOTE  Date:  12/12/2015  Patient Details  Name: Timothy Ramos MRN: [id number] Date of Birth: 09/28/1923  Clinical Social Work is seeking post-discharge placement for this patient at the Skilled  Nursing Facility level of care (*CSW will initial, date and re-position this form in  chart as items are completed):  No   Patient/family provided with [facility]'s list of facilities offering this level of care within the geographic area requested by the patient (or if unable, by the patient's family).  No   Patient/family informed of their freedom to choose among providers that offer the needed level of care, that participate in Medicare, Medicaid or managed care program needed by the patient, have an available bed and are willing to accept the patient.  No   Patient/family informed of [facility]'s ownership interest in [facility] and [facility], as well as of the fact that they are under no obligation to receive care at these facilities.  PASRR submitted to EDS on       PASRR number received on       Existing PASRR number confirmed on 12/10/15     FL2 transmitted to all facilities in geographic area requested by pt/family on 12/10/15     FL2 transmitted to all facilities within larger geographic area on       Patient informed that his/her managed care company has contracts with or will negotiate with certain facilities, including the following:        Yes   Patient/family informed of bed offers received.  Patient chooses bed at  [facility])     Physician recommends and patient chooses bed at      Patient to be transferred to  [facility]) on  .  Patient to be transferred to facility by       Patient family notified on 12/12/15 of transfer.  Name of family member notified:   Eligah East([place] (Daughter) [phone])     PHYSICIAN       Additional Comment:     _______________________________________________ Starr SinclairSamantha L Abhijay Morriss, LCSW 12/12/2015, 12:02 PM

## 2015-12-12 NOTE — Progress Notes (Signed)
Patient discharged to [facility],report called to receiving nurse,transported by EMS,Hemodynamics within normal limits upon discharge

## 2015-12-12 NOTE — Discharge Summary (Addendum)
[facility] - [place] at [facility]   PATIENT NAME: Timothy Ramos    MR#:  [id number]  DATE OF BIRTH:  June 27, 1923  DATE OF ADMISSION:  12/08/2015 ADMITTING PHYSICIAN: Ihor Austin, MD  DATE OF DISCHARGE: 12/12/2015 PRIMARY CARE PHYSICIAN: Crawford Givens, MD    ADMISSION DIAGNOSIS:  Elevated troponin [R79.89] Altered mental status, unspecified altered mental status type [R41.82]   DISCHARGE DIAGNOSIS:  HCAP, right lower lung pneumonia. Metabolic encephalopathy SECONDARY DIAGNOSIS:   Past Medical History  Diagnosis Date  . HTN (hypertension) 1992  . Renal insufficiency 04/1999    CDKIII-IV w CR ~ 2 per [facility]  . Diabetes mellitus, type 2 (HCC) 05/07/2002  . Gastroduodenitis 9/18.2000    EGD mild  . Intermittent vertigo 2011    better w physical therapy  . BPH (benign prostatic hyperplasia)     followed by urology   . Hyperlipidemia   . 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  1. Metabolic encephalopathy: This is due to right lower lung pneumonia. He is at baseline.   2.HCAP: Continue Levaquin Blood cultures are negative to date. Repeat Chest x-ray shows basilar atelectasis.  3. Elevated troponin: This is due to demand ischemia and not ACS. ECHO was limited and technically difficult. Systolic function was normal. The estimated ejection fraction was in the range of 50%  to 55%. Regional wall motion abnormalities cannot be excluded.  Select images with basal to mid anterior wall hypokinesis.  Doppler parameters are consistent with abnormal left ventricular  relaxation (grade 1 diastolic dysfunction). Continue aspirin.  4. Diet-controlled Type 2 diabetes without complication: Continue sliding scale insulin  5. Essential hypertension: Continue clonidine and lisinopril. no beta  blocker due to bradycardia  6. Gastroduodenitis: Continue Carafate and PPI.  7. Chronic kidney disease stage III: a little worse. Hold nephrotoxic agents. F/u BMP. . 8. Left knee swelling: X-ray shows effusion. No DVT. Continue with conservative measures at this time. Physical therapy as tolerated.  9. Fall: Patient needs skilled nursing facility at discharge  * Hyponatremia. Improved. * Leukopenia. Unclear etiology, f/u CBC as outpatient. * Thrombocytopenia. Stop lovenox, f/u hematology as outpatient. * Anemia. Hemoglobin decreased from 12.1-9.3. No active bleeding, possible due to IV fluid dilution. Hemoglobin is stable, negative stool occult.  DISCHARGE CONDITIONS:   Stable, discharge to SNF today.  CONSULTS OBTAINED:  Treatment Team:  Laurier Nancy, MD Donato Heinz, MD  DRUG ALLERGIES:   Allergies  Allergen Reactions  . Doxazosin Mesylate Other (See Comments)    Reaction:  Unknown   . Hydrochlorothiazide W-Triamterene Other (See Comments)    Reaction:  Unknown   . Penicillins Other (See Comments)    Reaction:  Unknown   . Simvastatin Other (See Comments)    Reaction:  Unknown     DISCHARGE MEDICATIONS:   Current Discharge Medication List    START taking these medications   Details  levofloxacin (LEVAQUIN) 750 MG tablet Take 1 tablet (750 mg total) by mouth every other day. Qty: 3 tablet, Refills: 0      CONTINUE these medications which have CHANGED   Details  HYDROcodone-acetaminophen (NORCO/VICODIN) 5-325 MG tablet Take 1 tablet by mouth every 6 (six) hours as needed for moderate pain. Qty: 16 tablet, Refills: 0      CONTINUE these medications  which have NOT CHANGED   Details  acetaminophen (TYLENOL) 325 MG tablet Take 650 mg by mouth every 6 (six) hours as needed for mild pain, fever or headache.    acidophilus (RISAQUAD) CAPS capsule Take 1 capsule by mouth daily.    alum & mag hydroxide-simeth (MAALOX PLUS) 400-400-40 MG/5ML suspension Take 30  mLs by mouth every 4 (four) hours as needed for indigestion.    aspirin EC 81 MG tablet Take 81 mg by mouth daily.    carbamide peroxide (DEBROX) 6.5 % otic solution Place 5 drops into both ears 2 (two) times daily as needed (for ear itching/ear wax).    cholecalciferol (VITAMIN D) 1000 UNITS tablet Take 2,000 Units by mouth daily.     cloNIDine (CATAPRES) 0.1 MG tablet Take 0.1 mg by mouth every 12 (twelve) hours as needed (for elevated BP).    divalproex (DEPAKOTE SPRINKLE) 125 MG capsule Take 125-250 mg by mouth 2 (two) times daily. Pt takes one capsule in the morning and two at bedtime.    esomeprazole (NEXIUM) 40 MG capsule Take 40 mg by mouth 2 (two) times daily before a meal.    guaifenesin (ROBITUSSIN) 100 MG/5ML syrup Take 100 mg by mouth every 4 (four) hours as needed for cough.    lisinopril (PRINIVIL,ZESTRIL) 10 MG tablet Take 10 mg by mouth daily.    magnesium hydroxide (MILK OF MAGNESIA) 400 MG/5ML suspension Take 30 mLs by mouth daily as needed for mild constipation.    Multiple Vitamin (MULTIVITAMIN WITH MINERALS) TABS tablet Take 1 tablet by mouth daily.    nitroGLYCERIN (NITROSTAT) 0.4 MG SL tablet Place 0.4 mg under the tongue every 5 (five) minutes as needed for chest pain.    senna-docusate (SENOKOT-S) 8.6-50 MG tablet Take 1 tablet by mouth at bedtime.    simethicone (MYLICON) 80 MG chewable tablet Chew 80 mg by mouth every 6 (six) hours as needed for flatulence.    sucralfate (CARAFATE) 1 g tablet Take 1 g by mouth 4 (four) times daily -  with meals and at bedtime.         DISCHARGE INSTRUCTIONS:    If you experience worsening of your admission symptoms, develop shortness of breath, life threatening emergency, suicidal or homicidal thoughts you must seek medical attention immediately by calling 911 or calling your MD immediately  if symptoms less severe.  You Must read complete instructions/literature along with all the possible adverse reactions/side  effects for all the Medicines you take and that have been prescribed to you. Take any new Medicines after you have completely understood and accept all the possible adverse reactions/side effects.   Please note  You were cared for by a hospitalist during your hospital stay. If you have any questions about your discharge medications or the care you received while you were in the hospital after you are discharged, you can call the unit and asked to speak with the hospitalist on call if the hospitalist that took care of you is not available. Once you are discharged, your primary care physician will handle any further medical issues. Please note that NO REFILLS for any discharge medications will be authorized once you are discharged, as it is imperative that you return to your primary care physician (or establish a relationship with a primary care physician if you do not have one) for your aftercare needs so that they can reassess your need for medications and monitor your lab values.    Today   SUBJECTIVE   No  complaint. No right knee pain   VITAL SIGNS:  Blood pressure 123/63, pulse 61, temperature 97.7 F (36.5 C), temperature source Oral, resp. rate 18, height 5\' 11"  (1.803 m), weight 81.149 kg (178 lb 14.4 oz), SpO2 98 %.  I/O:   Intake/Output Summary (Last 24 hours) at 12/12/15 0840 Last data filed at 12/12/15 0549  Gross per 24 hour  Intake      0 ml  Output    350 ml  Net   -350 ml    PHYSICAL EXAMINATION:  GENERAL:  82 y.o.-year-old patient lying in the bed with no acute distress.  EYES: Pupils equal, round, reactive to light and accommodation. No scleral icterus. Extraocular muscles intact.  HEENT: Head atraumatic, normocephalic. Oropharynx and nasopharynx clear.  NECK:  Supple, no jugular venous distention. No thyroid enlargement, no tenderness.  LUNGS: Normal breath sounds bilaterally, no wheezing, rales,rhonchi or crepitation. No use of accessory muscles of respiration.   CARDIOVASCULAR: S1, S2 normal. No murmurs, rubs, or gallops.  ABDOMEN: Soft, non-tender, non-distended. Bowel sounds present. No organomegaly or mass.  EXTREMITIES: No pedal edema, cyanosis, or clubbing. Better right knee swelling, no tenderness or erythema. NEUROLOGIC: Cranial nerves II through XII are intact. Muscle strength 4/5 in all extremities. Sensation intact. Gait not checked.  PSYCHIATRIC: The patient is alert and oriented x 3.  SKIN: No obvious rash, lesion, or ulcer.   DATA REVIEW:   CBC  Recent Labs Lab 12/12/15 0633  WBC 2.8*  HGB 9.1*  HCT 26.5*  PLT 57*    Chemistries   Recent Labs Lab 12/08/15 0453  12/12/15 0633  NA 132*  < > 133*  K 4.8  < > 4.2  CL 102  < > 107  CO2 24  < > 19*  GLUCOSE 116*  < > 92  BUN 34*  < > 45*  CREATININE 1.89*  < > 2.02*  CALCIUM 10.1  < > 9.1  AST 30  --   --   ALT 16*  --   --   ALKPHOS 62  --   --   BILITOT 0.5  --   --   < > = values in this interval not displayed.  Cardiac Enzymes  Recent Labs Lab 12/09/15 2236  TROPONINI 0.14*    Microbiology Results  Results for orders placed or performed during the hospital encounter of 12/08/15  Blood culture (routine x 2)     Status: None (Preliminary result)   Collection Time: 12/08/15  6:26 AM  Result Value Ref Range Status   Specimen Description BLOOD BLOOD RIGHT FOREARM  Final   Special Requests BOTTLES DRAWN AEROBIC AND ANAEROBIC  Final   Culture NO GROWTH 3 DAYS  Final   Report Status PENDING  Incomplete  Blood culture (routine x 2)     Status: None (Preliminary result)   Collection Time: 12/08/15  6:27 AM  Result Value Ref Range Status   Specimen Description BLOOD LEFT HAND  Final   Special Requests BOTTLES DRAWN AEROBIC AND ANAEROBIC  Final   Culture NO GROWTH 3 DAYS  Final   Report Status PENDING  Incomplete  MRSA PCR Screening     Status: None   Collection Time: 12/09/15 10:25 PM  Result Value Ref Range Status   MRSA by PCR NEGATIVE NEGATIVE  Final    Comment:        The GeneXpert MRSA Assay (FDA approved for NASAL specimens only), is one component of a comprehensive MRSA colonization  surveillance program. It is not intended to diagnose MRSA infection nor to guide or monitor treatment for MRSA infections.     RADIOLOGY:  Dg Chest 1 View  12/10/2015  CLINICAL DATA:  Patient with history of pneumonia.  Diabetes. EXAM: CHEST 1 VIEW COMPARISON:  Chest radiograph 12/08/2015. FINDINGS: Monitoring leads overlie the patient. Stable cardiac and mediastinal contours. No consolidative pulmonary opacities. Left basilar airspace opacities. Thoracic spine degenerative changes. IMPRESSION: Left basilar atelectasis.  No acute cardiopulmonary process. Electronically Signed   By: Annia Belt M.D.   On: 12/10/2015 08:56   Dg Knee 1-2 Views Left  12/10/2015  CLINICAL DATA:  Fall from wheelchair 3 days ago with pain and swelling, initial encounter EXAM: LEFT KNEE - 1-2 VIEW COMPARISON:  None. FINDINGS: Medial joint space narrowing is noted. No acute fracture is seen. A joint effusion is noted. IMPRESSION: Joint effusion without acute bony abnormality. Mild degenerative changes are seen. Electronically Signed   By: Alcide Clever M.D.   On: 12/10/2015 09:02   US Venous Img Lower Unilateral Left  12/10/2015  CLINICAL DATA:  Left lower extremity swelling. EXAM: LEFT LOWER EXTREMITY VENOUS DOPPLER ULTRASOUND TECHNIQUE: Gray-scale sonography with graded compression, as well as color Doppler and duplex ultrasound were performed to evaluate the lower extremity deep venous systems from the level of the common femoral vein and including the common femoral, femoral, profunda femoral, popliteal and calf veins including the posterior tibial, peroneal and gastrocnemius veins when visible. The superficial great saphenous vein was also interrogated. Spectral Doppler was utilized to evaluate flow at rest and with distal augmentation maneuvers in the common femoral,  femoral and popliteal veins. COMPARISON:  No recent prior. FINDINGS: Contralateral Common Femoral Vein: Respiratory phasicity is normal and symmetric with the symptomatic side. No evidence of thrombus. Normal compressibility. Common Femoral Vein: No evidence of thrombus. Normal compressibility, respiratory phasicity and response to augmentation. Saphenofemoral Junction: No evidence of thrombus. Normal compressibility and flow on color Doppler imaging. Profunda Femoral Vein: No evidence of thrombus. Normal compressibility and flow on color Doppler imaging. Femoral Vein: No evidence of thrombus. Normal compressibility, respiratory phasicity and response to augmentation. Popliteal Vein: No evidence of thrombus. Normal compressibility, respiratory phasicity and response to augmentation. Calf Veins: No evidence of thrombus. Normal compressibility and flow on color Doppler imaging. Superficial Great Saphenous Vein: No evidence of thrombus. Normal compressibility and flow on color Doppler imaging. Other Findings:  None. IMPRESSION: No evidence of deep venous thrombosis. Electronically Signed   By: Maisie Fus  Register   On: 12/10/2015 11:33        Management plans discussed with the patient, family and they are in agreement.  CODE STATUS:     Code Status Orders        Start     Ordered   12/08/15 1144  Do not attempt resuscitation (DNR)   Continuous    Question Answer Comment  In the event of cardiac or respiratory ARREST Do not call a "code blue"   In the event of cardiac or respiratory ARREST Do not perform Intubation, CPR, defibrillation or ACLS   In the event of cardiac or respiratory ARREST Use medication by any route, position, wound care, and other measures to relive pain and suffering. May use oxygen, suction and manual treatment of airway obstruction as needed for comfort.      12/08/15 1143    Code Status History    Date Active Date Inactive Code Status Order ID Comments User Context  12/08/2015  7:35 AM 12/08/2015 11:43 AM Full Code [id number]  Ihor AustinPavan Pyreddy, MD ED      TOTAL TIME TAKING CARE OF THIS PATIENT: 35 minutes.    Shaune Pollackhen, Brek Reece M.D on 12/12/2015 at 8:40 AM  Between 7am to 6pm - Pager - [phone]  After 6pm go to [email or web address] - password [email or web address] [facility]  [facility]  Office  [phone]  CC: Primary care physician; Crawford GivensGraham Duncan, MD

## 2015-12-12 NOTE — Discharge Instructions (Signed)
Heart healthy and ADA diet. °Activity as tolerated. °Aspiration and fall precaution. °

## 2015-12-12 NOTE — Progress Notes (Signed)
CSW called [facility] via phone to notify of patient d/c to SNF. Per Gala Romneyoug, patient can return to facility today. Doug reported, patient was on the ALF side but will be going to SNF rehab side. Discharge summary faxed to facility at [phone]. Discharge packet completed with DNR, Discharge summary, and Prescriptions in packet. Nurse Micah NoelLance was notified that packet was ready. CSW was not given room number. Nurse will be given room number during report. [facility] Contact number for report is [phone].  Patient was alert and lying in bed. CSW notified patient of d/c at bedside. CSW attempt to notify patient daughter Eligah Eastlvalena by phone at [phone], but no response. CSW left a voicemail to return call.    Sherryl MangesSamantha Meera Vasco, BSW, MSW, LCSWA Clinical Social Work Dept [phone]

## 2015-12-12 NOTE — Progress Notes (Signed)
SUBJECTIVE: Feeling fine eating lunch   Filed Vitals:   12/11/15 1959 12/12/15 0606 12/12/15 0845 12/12/15 1138  BP: 132/56 123/63 126/68 137/58  Pulse: 63 61 60 64  Temp: 97.9 F (36.6 C) 97.7 F (36.5 C) 97.8 F (36.6 C) 97.9 F (36.6 C)  TempSrc: Oral Oral Oral Oral  Resp: 18 18 18 18   Height:      Weight:      SpO2: 97% 98% 97% 98%    Intake/Output Summary (Last 24 hours) at 12/12/15 1316 Last data filed at 12/12/15 0830  Gross per 24 hour  Intake      0 ml  Output    125 ml  Net   -125 ml    LABS: Basic Metabolic Panel:  Recent Labs  40/98/1102/08/18 0532 12/12/15 0633  NA 135 133*  K 4.6 4.2  CL 108 107  CO2 19* 19*  GLUCOSE 86 92  BUN 40* 45*  CREATININE 1.97* 2.02*  CALCIUM 9.5 9.1   Liver Function Tests: No results for input(s): AST, ALT, ALKPHOS, BILITOT, PROT, ALBUMIN in the last 72 hours. No results for input(s): LIPASE, AMYLASE in the last 72 hours. CBC:  Recent Labs  12/11/15 0532 12/12/15 0633  WBC 2.9* 2.8*  HGB 9.3* 9.1*  HCT 26.9* 26.5*  MCV 96.9 95.7  PLT 61* 57*   Cardiac Enzymes:  Recent Labs  12/09/15 1836 12/09/15 2236  TROPONINI 0.15* 0.14*   BNP: Invalid input(s): POCBNP D-Dimer: No results for input(s): DDIMER in the last 72 hours. Hemoglobin A1C: No results for input(s): HGBA1C in the last 72 hours. Fasting Lipid Panel: No results for input(s): CHOL, HDL, LDLCALC, TRIG, CHOLHDL, LDLDIRECT in the last 72 hours. Thyroid Function Tests: No results for input(s): TSH, T4TOTAL, T3FREE, THYROIDAB in the last 72 hours.  Invalid input(s): FREET3 Anemia Panel: No results for input(s): VITAMINB12, FOLATE, FERRITIN, TIBC, IRON, RETICCTPCT in the last 72 hours.   PHYSICAL EXAM General: Well developed, well nourished, in no acute distress HEENT:  Normocephalic and atramatic Neck:  No JVD.  Lungs: Clear bilaterally to auscultation and percussion. Heart: HRRR . Normal S1 and S2 without gallops or murmurs.  Abdomen: Bowel  sounds are positive, abdomen soft and non-tender  Msk:  Back normal, normal gait. Normal strength and tone for age. Extremities: No clubbing, cyanosis or edema.   Neuro: Alert and oriented X 3. Psych:  Good affect, responds appropriately  TELEMETRY: Sinus rhythm  ASSESSMENT AND PLAN: Mildly elevated troponin clinically doing fine and we will sign off.  Principal Problem:   Non-STEMI (non-ST elevated myocardial infarction) [facility](HCC) Active Problems:   Fall   Atelectasis    Laurier NancyKHAN,Kynzlee Hucker A, MD, [facility] 12/12/2015 1:16 PM

## 2015-12-13 LAB — CULTURE, BLOOD (ROUTINE X 2)
CULTURE: NO GROWTH
Culture: NO GROWTH

## 2016-03-02 DEATH — deceased

## 2016-11-27 IMAGING — US US EXTREM LOW VENOUS*L*
1 series · 13 of 24 positions shown · non-contrast
Comparison: No recent prior.

CLINICAL DATA: Left lower extremity swelling.



[Series 1: us extrem low venous*left* · 0.08mm/px · 13 of 46 slices shown]
[im 1/46]
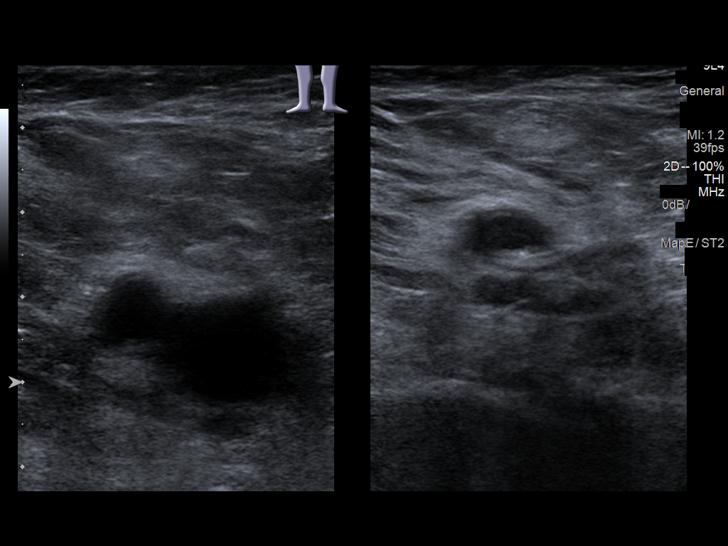
[im 4/46]
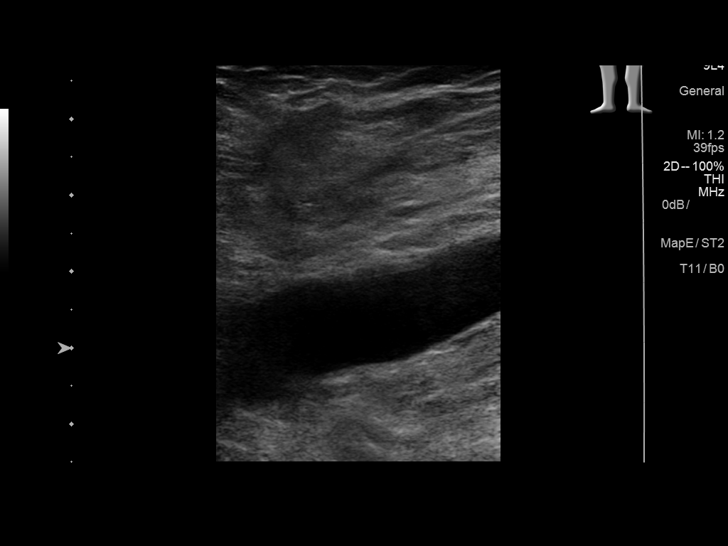
[im 8/46]
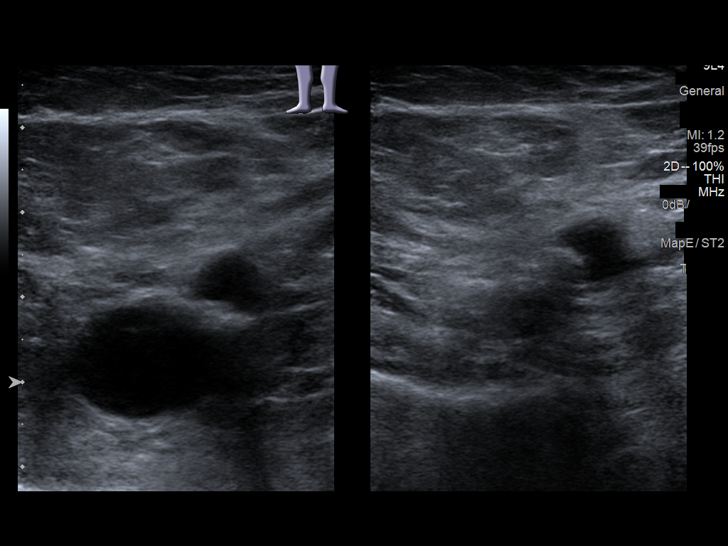
[im 12/46]
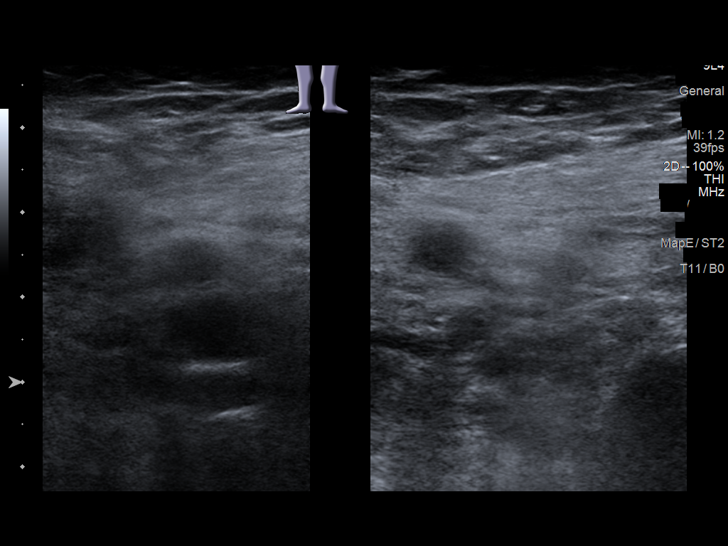
[im 16/46]
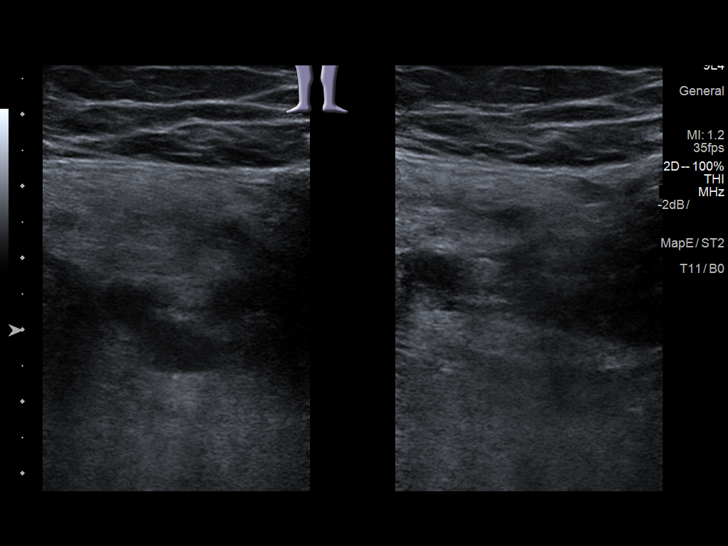
[im 20/46]
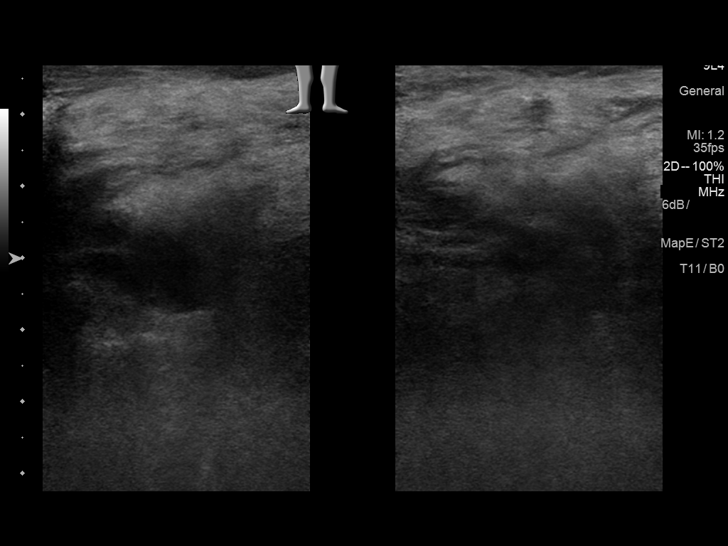
[im 24/46]
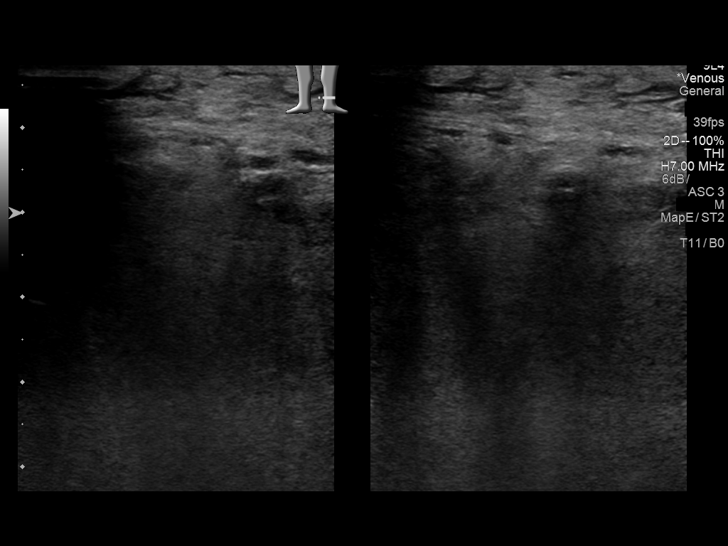
[im 26/46]
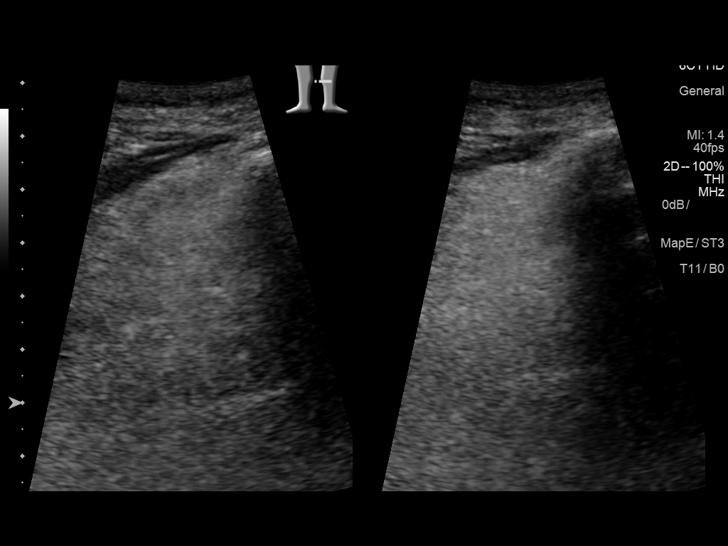
[im 30/46]
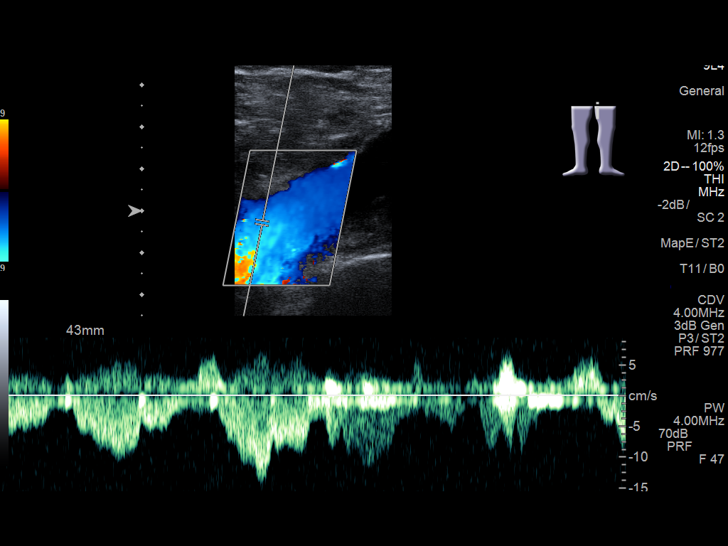
[im 34/46]
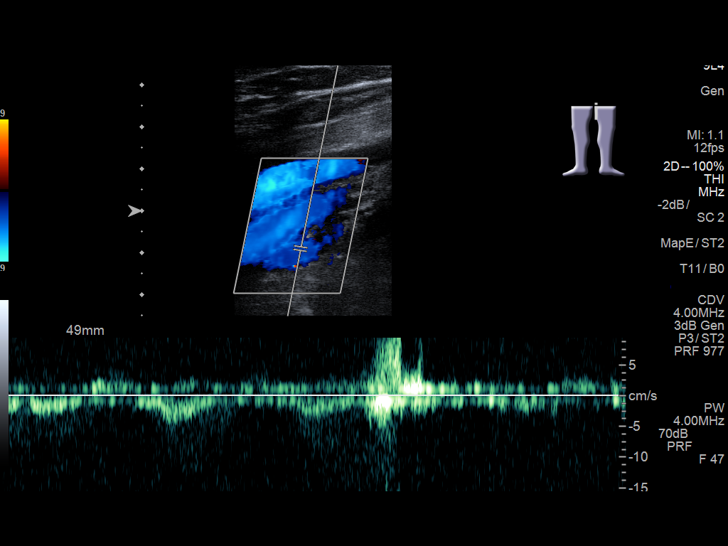
[im 38/46]
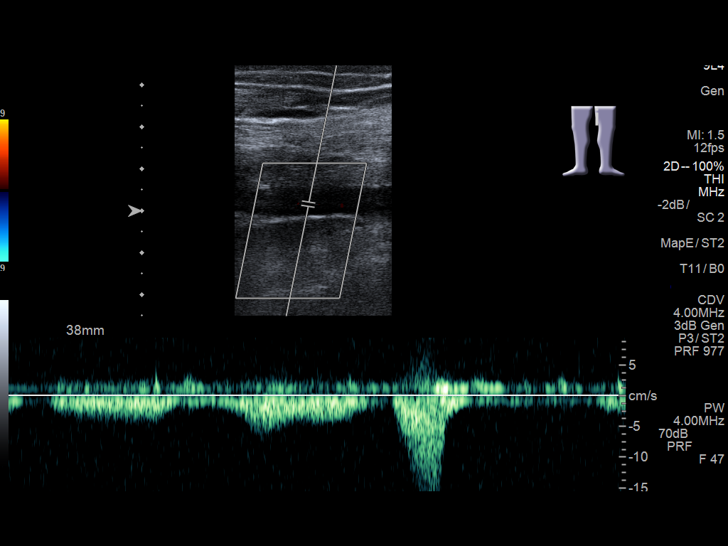
[im 42/46]
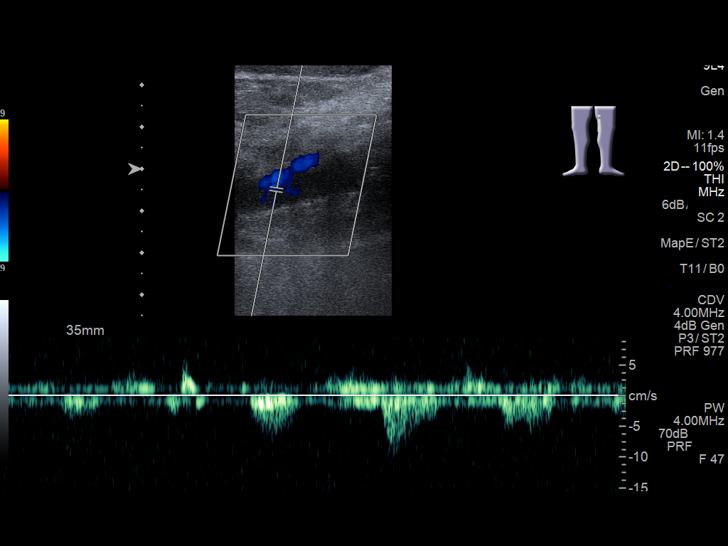
[im 46/46]
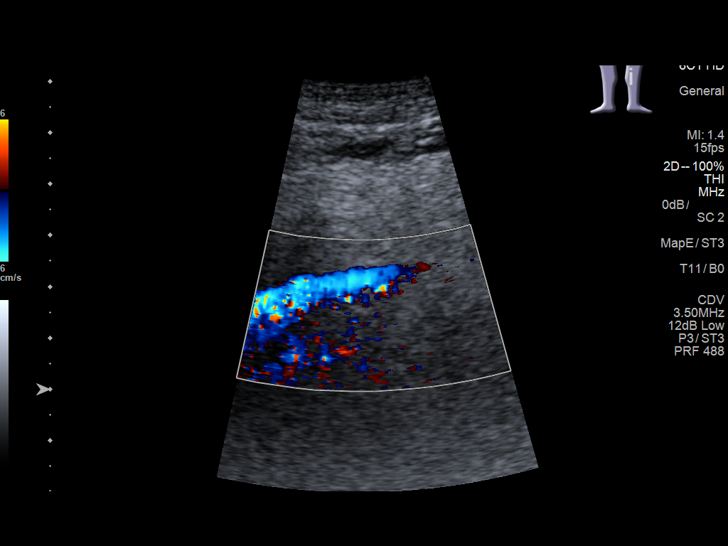

[13 of 24 positions shown; findings below may reference images not displayed]

FINDINGS: Contralateral Common Femoral Vein: Respiratory phasicity is normal
and symmetric with the symptomatic side. No evidence of thrombus.
Normal compressibility.

Common Femoral Vein: No evidence of thrombus. Normal
compressibility, respiratory phasicity and response to augmentation.

Saphenofemoral Junction: No evidence of thrombus. Normal
compressibility and flow on color Doppler imaging.

Profunda Femoral Vein: No evidence of thrombus. Normal
compressibility and flow on color Doppler imaging.

Femoral Vein: No evidence of thrombus. Normal compressibility,
respiratory phasicity and response to augmentation.

Popliteal Vein: No evidence of thrombus. Normal compressibility,
respiratory phasicity and response to augmentation.

Calf Veins: No evidence of thrombus. Normal compressibility and flow
on color Doppler imaging.

Superficial Great Saphenous Vein: No evidence of thrombus. Normal
compressibility and flow on color Doppler imaging.

Other Findings:  None.
IMPRESSION: No evidence of deep venous thrombosis.
